# Patient Record
Sex: Female | Born: 1990
Health system: Southern US, Community
[De-identification: ages and names within clinical notes are randomized; demographics above are authoritative.]

## PROBLEM LIST (undated history)

## (undated) DIAGNOSIS — F32A Depression, unspecified: Secondary | ICD-10-CM

## (undated) DIAGNOSIS — K589 Irritable bowel syndrome without diarrhea: Secondary | ICD-10-CM

## (undated) DIAGNOSIS — D649 Anemia, unspecified: Secondary | ICD-10-CM

## (undated) DIAGNOSIS — F329 Major depressive disorder, single episode, unspecified: Secondary | ICD-10-CM

## (undated) DIAGNOSIS — F419 Anxiety disorder, unspecified: Secondary | ICD-10-CM

## (undated) HISTORY — PX: BREAST SURGERY: SHX581

## (undated) HISTORY — DX: Depression, unspecified: F32.A

## (undated) HISTORY — DX: Anxiety disorder, unspecified: F41.9

## (undated) HISTORY — DX: Major depressive disorder, single episode, unspecified: F32.9

## (undated) HISTORY — DX: Anemia, unspecified: D64.9

## (undated) HISTORY — DX: Irritable bowel syndrome, unspecified: K58.9

---
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
## 2012-02-26 ENCOUNTER — Encounter: Payer: Self-pay | Admitting: Family Medicine

## 2012-02-26 ENCOUNTER — Ambulatory Visit (INDEPENDENT_AMBULATORY_CARE_PROVIDER_SITE_OTHER): Payer: 59 | Admitting: Family Medicine

## 2012-02-26 ENCOUNTER — Ambulatory Visit: Payer: 59 | Admitting: Family Medicine

## 2012-02-26 VITALS — BP 120/80 | Temp 99.0°F | Wt 184.0 lb

## 2012-02-26 DIAGNOSIS — R5383 Other fatigue: Secondary | ICD-10-CM

## 2012-02-26 DIAGNOSIS — F418 Other specified anxiety disorders: Secondary | ICD-10-CM

## 2012-02-26 DIAGNOSIS — F341 Dysthymic disorder: Secondary | ICD-10-CM

## 2012-02-26 LAB — CBC WITH DIFFERENTIAL/PLATELET
Basophils Relative: 0.4 % (ref 0.0–3.0)
Eosinophils Absolute: 0.1 10*3/uL (ref 0.0–0.7)
HCT: 37.8 % (ref 36.0–46.0)
Lymphocytes Relative: 30.5 % (ref 12.0–46.0)
MCHC: 33.2 g/dL (ref 30.0–36.0)
Platelets: 251 10*3/uL (ref 150.0–400.0)
RDW: 14.3 % (ref 11.5–14.6)

## 2012-02-26 LAB — FERRITIN: Ferritin: 7.1 ng/mL — ABNORMAL LOW (ref 10.0–291.0)

## 2012-02-26 LAB — BASIC METABOLIC PANEL
Chloride: 106 mEq/L (ref 96–112)
GFR: 134.12 mL/min (ref >60.00)
Glucose, Bld: 89 mg/dL (ref 70–99)
Potassium: 3.9 mEq/L (ref 3.5–5.1)

## 2012-02-26 LAB — TSH: TSH: 0.39 u[IU]/mL (ref 0.35–5.50)

## 2012-02-26 LAB — IRON: Iron: 92 ug/dL (ref 42–145)

## 2012-02-26 LAB — HEPATIC FUNCTION PANEL
ALT: 15 U/L (ref 0–35)
AST: 17 U/L (ref 0–37)

## 2012-02-26 MED ORDER — LORAZEPAM 1 MG PO TABS
1.0000 mg | ORAL_TABLET | Freq: Two times a day (BID) | ORAL | Status: DC | PRN
Start: 2012-02-26 — End: 2012-08-29

## 2012-02-26 MED ORDER — FLUOXETINE HCL 20 MG PO TABS: 20.0000 mg | ORAL_TABLET | Freq: Every day | ORAL | Status: DC

## 2012-02-26 NOTE — Progress Notes (Signed)
  Subjective:    Patient ID: April Rivas, female    DOB: 05-18-1990, 22 y.o.   MRN: [id number]  HPI Here with her mother to discuss anxiety and depression. She has struggled with these off and on since she was 22 years old. At one point when she was younger she used to cut herself on the ams but she stopped this years ago. She feels sad, hopeless, worried and out of control at times. She admits to suicidal thoughts but denies ever having a plan or the intent to do this. She has trouble sleeping and her appetite is reduced. She may go all day eating nothing but a cup of yogurt. She feels tired all the time, and she has had dull muscle aches frequently, especially in the thighs. She has a hx of anemia, and was turned down last month when she tried to donate blood. He menses are regular although they are heavy. She is a sophomore at [employer], but she has taken a medical leave of absence this semester to come home and seek treatment.    Review of Systems  Constitutional: Positive for fatigue.  Respiratory: Negative.   Cardiovascular: Negative.   Neurological: Negative.   Psychiatric/Behavioral: Positive for suicidal ideas, sleep disturbance, dysphoric mood and decreased concentration. Negative for hallucinations, confusion and agitation. The patient is nervous/anxious.        Objective:   Physical Exam  Constitutional: She is oriented to person, place, and time. She appears well-developed and well-nourished.  Neck: No thyromegaly present.  Cardiovascular: Normal rate, regular rhythm, normal heart sounds and intact distal pulses.   Pulmonary/Chest: Effort normal and breath sounds normal.  Lymphadenopathy:    She has no cervical adenopathy.  Neurological: She is alert and oriented to person, place, and time.  Psychiatric: She has a normal mood and affect. Her behavior is normal. Judgment and thought content normal.          Assessment & Plan:  Start on Prozac every morning and  on Lorazepam at night for sleep. Recheck here in 2 weeks. She is already set up to see Dr. Evalee Jefferson this afternoon for therapy. Get labs today.

## 2012-02-27 ENCOUNTER — Ambulatory Visit: Payer: 59 | Admitting: Family Medicine

## 2012-03-03 MED ORDER — FERROUS SULFATE 325 (65 FE) MG PO TABS: 325.0000 mg | ORAL_TABLET | Freq: Every day | ORAL | Status: DC

## 2012-03-03 NOTE — Progress Notes (Signed)
Quick Note:  I spoke with pt and sent script e-scribe. ______ 

## 2012-03-03 NOTE — Addendum Note (Signed)
Addended by: Aniceto Boss A on: 03/03/2012 10:35 AM   Modules accepted: Orders

## 2012-03-11 ENCOUNTER — Ambulatory Visit (INDEPENDENT_AMBULATORY_CARE_PROVIDER_SITE_OTHER): Payer: 59 | Admitting: Family Medicine

## 2012-03-11 ENCOUNTER — Encounter: Payer: Self-pay | Admitting: Family Medicine

## 2012-03-11 VITALS — BP 112/74 | HR 88 | Temp 98.7°F | Wt 184.0 lb

## 2012-03-11 DIAGNOSIS — F329 Major depressive disorder, single episode, unspecified: Secondary | ICD-10-CM

## 2012-03-11 DIAGNOSIS — F341 Dysthymic disorder: Secondary | ICD-10-CM

## 2012-03-11 NOTE — Progress Notes (Signed)
  Subjective:    Patient ID: April Rivas, female    DOB: 24-Oct-1990, 22 y.o.   MRN: [id number]  HPI Here to follow up anxiety and depression. She has been taking Prozac 20 mg each morning and Lorazepam 1 mg at bedtime. She has not taken any Lorazepam during the daytime. She feels a little better with less anxiety, but she is dealing with feelings of anger more than before. She has met with Dr. Richardson Dopp for therapy twice and she thinks this will be helpful. She slept well the first week but now she has trouble relaxing at night again. She is looking for a job.    Review of Systems  Constitutional: Negative.   Neurological: Negative.   Psychiatric/Behavioral: Positive for sleep disturbance, dysphoric mood and decreased concentration. Negative for behavioral problems, confusion and agitation. The patient is nervous/anxious.        Objective:   Physical Exam  Constitutional: She is oriented to person, place, and time. She appears well-developed and well-nourished.  Neurological: She is alert and oriented to person, place, and time.  Psychiatric: She has a normal mood and affect. Her behavior is normal. Thought content normal.          Assessment & Plan:  We will keep the Prozac the same but increase the Lorazepam to 2 tabs qhs. Recheck in 2 weeks

## 2012-08-29 ENCOUNTER — Encounter: Payer: Self-pay | Admitting: Family Medicine

## 2012-08-29 ENCOUNTER — Ambulatory Visit (INDEPENDENT_AMBULATORY_CARE_PROVIDER_SITE_OTHER): Payer: 59 | Admitting: Family Medicine

## 2012-08-29 VITALS — BP 114/76 | HR 97 | Temp 99.3°F | Ht 67.5 in | Wt 188.0 lb

## 2012-08-29 DIAGNOSIS — Z0000 Encounter for general adult medical examination without abnormal findings: Secondary | ICD-10-CM

## 2012-08-29 LAB — CBC WITH DIFFERENTIAL/PLATELET
Basophils Absolute: 0 10*3/uL (ref 0.0–0.1)
Basophils Relative: 0.3 % (ref 0.0–3.0)
Eosinophils Relative: 1.1 % (ref 0.0–5.0)
MCHC: 33.2 g/dL (ref 30.0–36.0)
MCV: 84.1 fl (ref 78.0–100.0)
Monocytes Relative: 8.6 % (ref 3.0–12.0)
Neutro Abs: 4.7 10*3/uL (ref 1.4–7.7)
Neutrophils Relative %: 66.9 % (ref 43.0–77.0)
Platelets: 234 10*3/uL (ref 150.0–400.0)

## 2012-08-29 LAB — BASIC METABOLIC PANEL
BUN: 8 mg/dL (ref 6–23)
Calcium: 9.3 mg/dL (ref 8.4–10.5)
Creatinine, Ser: 0.6 mg/dL (ref 0.4–1.2)
GFR: 135.98 mL/min (ref >60.00)
Sodium: 139 mEq/L (ref 135–145)

## 2012-08-29 LAB — LIPID PANEL
Cholesterol: 148 mg/dL (ref 0–200)
HDL: 41.9 mg/dL (ref >39.00)
Triglycerides: 56 mg/dL (ref 0.0–149.0)
VLDL: 11.2 mg/dL (ref 0.0–40.0)

## 2012-08-29 LAB — HEPATIC FUNCTION PANEL
Alkaline Phosphatase: 84 U/L (ref 39–117)
Bilirubin, Direct: 0.1 mg/dL (ref 0.0–0.3)
Total Bilirubin: 0.4 mg/dL (ref 0.3–1.2)

## 2012-08-29 LAB — POCT URINALYSIS DIPSTICK
Leukocytes, UA: NEGATIVE
Nitrite, UA: NEGATIVE
Protein, UA: NEGATIVE
pH, UA: 5.5

## 2012-08-29 NOTE — Progress Notes (Signed)
  Subjective:    Patient ID: April Rivas, female    DOB: September 13, 1990, 22 y.o.   MRN: [id number]  HPI 22 yr old female for a cpx. She has questions about her meds. She is seeing Velta Addison for psychiatric care, and she is frustrated about how Prozac makes her feel tired all the time and it makes it hard for her to lose weight. She watches her diet and exercises every day.    Review of Systems  Constitutional: Negative.   HENT: Negative.   Eyes: Negative.   Respiratory: Negative.   Cardiovascular: Negative.   Gastrointestinal: Negative.   Genitourinary: Negative for dysuria, urgency, frequency, hematuria, flank pain, decreased urine volume, enuresis, difficulty urinating, pelvic pain and dyspareunia.  Musculoskeletal: Negative.   Skin: Negative.   Neurological: Negative.   Psychiatric/Behavioral: Negative.        Objective:   Physical Exam  Constitutional: She is oriented to person, place, and time. She appears well-developed and well-nourished. No distress.  HENT:  Head: Normocephalic and atraumatic.  Right Ear: External ear normal.  Left Ear: External ear normal.  Nose: Nose normal.  Mouth/Throat: Oropharynx is clear and moist. No oropharyngeal exudate.  Eyes: Conjunctivae and EOM are normal. Pupils are equal, round, and reactive to light. No scleral icterus.  Neck: Normal range of motion. Neck supple. No JVD present. No thyromegaly present.  Cardiovascular: Normal rate, regular rhythm, normal heart sounds and intact distal pulses.  Exam reveals no gallop and no friction rub.   No murmur heard. Pulmonary/Chest: Effort normal and breath sounds normal. No respiratory distress. She has no wheezes. She has no rales. She exhibits no tenderness.  Abdominal: Soft. Bowel sounds are normal. She exhibits no distension and no mass. There is no tenderness. There is no rebound and no guarding.  Musculoskeletal: Normal range of motion. She exhibits no edema and no tenderness.   Lymphadenopathy:    She has no cervical adenopathy.  Neurological: She is alert and oriented to person, place, and time. She has normal reflexes. No cranial nerve deficit. She exhibits normal muscle tone. Coordination normal.  Skin: Skin is warm and dry. No rash noted. No erythema.  Psychiatric: She has a normal mood and affect. Her behavior is normal. Judgment and thought content normal.          Assessment & Plan:  Well exam. Get fasting labs. Encouraged her to talk to Mr. Claudius Sis about her concerns.

## 2012-09-01 NOTE — Progress Notes (Signed)
Quick Note:  I left voice message with results. ______ 

## 2012-09-08 ENCOUNTER — Encounter: Payer: Self-pay | Admitting: Family Medicine

## 2012-12-18 ENCOUNTER — Other Ambulatory Visit: Payer: Self-pay

## 2014-02-18 ENCOUNTER — Encounter: Payer: Self-pay | Admitting: Family Medicine

## 2014-02-18 ENCOUNTER — Ambulatory Visit (INDEPENDENT_AMBULATORY_CARE_PROVIDER_SITE_OTHER): Payer: 59 | Admitting: Family Medicine

## 2014-02-18 VITALS — BP 129/83 | HR 100 | Temp 99.1°F | Ht 67.5 in | Wt 188.0 lb

## 2014-02-18 DIAGNOSIS — R059 Cough, unspecified: Secondary | ICD-10-CM

## 2014-02-18 DIAGNOSIS — R05 Cough: Secondary | ICD-10-CM

## 2014-02-18 DIAGNOSIS — N6012 Diffuse cystic mastopathy of left breast: Secondary | ICD-10-CM

## 2014-02-18 DIAGNOSIS — N6011 Diffuse cystic mastopathy of right breast: Secondary | ICD-10-CM

## 2014-02-18 MED ORDER — ALBUTEROL SULFATE HFA 108 (90 BASE) MCG/ACT IN AERS: 2.0000 | INHALATION_SPRAY | RESPIRATORY_TRACT | Status: DC | PRN

## 2014-02-18 NOTE — Progress Notes (Signed)
   Subjective:    Patient ID: April HarvestNichole Rivas, female    DOB: 07/16/1990, 24 y.o.   MRN: [id number]  HPI Here about 2 issues. First she has had a chronic intermittent cough for the past 3 years. It gets worse if she is very active or is exercising. No chest pain or SOB. No GERD sx. She is not exposed to smoke. The cough is non-productive. She has used a [employer]vertising account plannerroair inhaler from her friend several times and it always helps her feel better. No hx of asthma when she was younger. Also several weeks ago she felt a lump in the right breast during her first ever self breast exam. It is not tender and she has no other sx, but she is concerned about it. She actaullay made an appt with a GYN doctor for a full exam next week but she wanted to get my opinion about this.   Review of Systems  Constitutional: Negative.   HENT: Negative.   Eyes: Negative.   Respiratory: Positive for cough. Negative for choking, chest tightness, shortness of breath, wheezing and stridor.   Cardiovascular: Negative.        Objective:   Physical Exam  Constitutional: She appears well-developed and well-nourished.  Neck: No thyromegaly present.  Cardiovascular: Normal rate, regular rhythm, normal heart sounds and intact distal pulses.   Pulmonary/Chest: Effort normal and breath sounds normal. No respiratory distress. She has no wheezes. She has no rales. She exhibits no tenderness.  Genitourinary:  Both breasts show some densities laterally consistent with fibrocystic disease, no definable lumps and no tenderness  Lymphadenopathy:    She has no cervical adenopathy.          Assessment & Plan:  As far as the fibrocystic breast changes I reassured her these are benign and no further action is needed. Of course her GYN will re-evaluate this next week. It is not clear what the etiology of her cough may be but she may in fact be developing some mild asthma. Try a Proair inhaler prn. Get a CXR today.

## 2014-02-18 NOTE — Progress Notes (Signed)
Pre visit review using our clinic review tool, if applicable. No additional management support is needed unless otherwise documented below in the visit note. 

## 2014-03-01 ENCOUNTER — Ambulatory Visit (INDEPENDENT_AMBULATORY_CARE_PROVIDER_SITE_OTHER): Payer: 59 | Admitting: Family Medicine

## 2014-03-01 ENCOUNTER — Encounter: Payer: Self-pay | Admitting: Family Medicine

## 2014-03-01 VITALS — BP 110/84 | HR 76 | Ht 67.5 in | Wt 186.0 lb

## 2014-03-01 DIAGNOSIS — F418 Other specified anxiety disorders: Secondary | ICD-10-CM

## 2014-03-01 MED ORDER — FLUOXETINE HCL 10 MG PO CAPS: 10.0000 mg | ORAL_CAPSULE | Freq: Every day | ORAL | Status: DC

## 2014-03-01 NOTE — Progress Notes (Signed)
Pre visit review using our clinic review tool, if applicable. No additional management support is needed unless otherwise documented below in the visit note. 

## 2014-03-01 NOTE — Progress Notes (Signed)
   Subjective:    Patient ID: April HarvestNichole Korber, female    DOB: 06/27/1990, 24 y.o.   MRN: [id number]  HPI Here to ask if she can start back on Prozac. She took this about 2 years ago for depression and anxiety, and it worked well for her. She took 20 mg daily at that time, and it made her feel a little "out of it". She starts back in college classes tomorrow and she is very anxious about this.    Review of Systems  Constitutional: Negative.   Neurological: Negative.   Psychiatric/Behavioral: Positive for dysphoric mood and decreased concentration. Negative for hallucinations, behavioral problems, confusion and agitation. The patient is nervous/anxious. The patient is not hyperactive.        Objective:   Physical Exam  Constitutional: She is oriented to person, place, and time. She appears well-developed and well-nourished.  Neurological: She is alert and oriented to person, place, and time.  Psychiatric: She has a normal mood and affect. Her behavior is normal. Judgment and thought content normal.          Assessment & Plan:  We will get back on Prozac but will try it at 10 mg daily. Recheck in one month

## 2014-03-08 ENCOUNTER — Other Ambulatory Visit: Payer: Self-pay | Admitting: *Deleted

## 2014-03-08 DIAGNOSIS — Z0000 Encounter for general adult medical examination without abnormal findings: Secondary | ICD-10-CM

## 2014-03-09 ENCOUNTER — Other Ambulatory Visit (INDEPENDENT_AMBULATORY_CARE_PROVIDER_SITE_OTHER): Payer: 59

## 2014-03-09 DIAGNOSIS — R05 Cough: Secondary | ICD-10-CM

## 2014-03-09 DIAGNOSIS — Z0000 Encounter for general adult medical examination without abnormal findings: Secondary | ICD-10-CM

## 2014-03-09 DIAGNOSIS — R059 Cough, unspecified: Secondary | ICD-10-CM

## 2014-03-09 LAB — COMPREHENSIVE METABOLIC PANEL
ALK PHOS: 69 U/L (ref 39–117)
ALT: 16 U/L (ref 0–35)
AST: 16 U/L (ref 0–37)
Albumin: 4 g/dL (ref 3.5–5.2)
BUN: 14 mg/dL (ref 6–23)
CO2: 23 mEq/L (ref 19–32)
Calcium: 9.2 mg/dL (ref 8.4–10.5)
Chloride: 107 mEq/L (ref 96–112)
Creatinine, Ser: 0.61 mg/dL (ref 0.40–1.20)
GFR: 131.4 mL/min (ref >60.00)
GLUCOSE: 89 mg/dL (ref 70–99)
POTASSIUM: 4 meq/L (ref 3.5–5.1)
Sodium: 139 mEq/L (ref 135–145)
TOTAL PROTEIN: 7.4 g/dL (ref 6.0–8.3)
Total Bilirubin: 0.4 mg/dL (ref 0.2–1.2)

## 2014-03-09 LAB — LIPID PANEL
CHOL/HDL RATIO: 3
CHOLESTEROL: 144 mg/dL (ref 0–200)
HDL: 43.1 mg/dL (ref >39.00)
LDL CALC: 78 mg/dL (ref 0–99)
NonHDL: 100.9
Triglycerides: 113 mg/dL (ref 0.0–149.0)
VLDL: 22.6 mg/dL (ref 0.0–40.0)

## 2014-03-09 LAB — CBC WITH DIFFERENTIAL/PLATELET
BASOS ABS: 0 10*3/uL (ref 0.0–0.1)
Basophils Relative: 0.4 % (ref 0.0–3.0)
EOS ABS: 0.1 10*3/uL (ref 0.0–0.7)
Eosinophils Relative: 1.8 % (ref 0.0–5.0)
HCT: 38.5 % (ref 36.0–46.0)
Hemoglobin: 12.9 g/dL (ref 12.0–15.0)
LYMPHS ABS: 1.4 10*3/uL (ref 0.7–4.0)
LYMPHS PCT: 27 % (ref 12.0–46.0)
MCHC: 33.5 g/dL (ref 30.0–36.0)
MCV: 80.5 fl (ref 78.0–100.0)
Monocytes Absolute: 0.5 10*3/uL (ref 0.1–1.0)
Monocytes Relative: 8.6 % (ref 3.0–12.0)
NEUTROS PCT: 62.2 % (ref 43.0–77.0)
Neutro Abs: 3.3 10*3/uL (ref 1.4–7.7)
Platelets: 205 10*3/uL (ref 150.0–400.0)
RBC: 4.78 Mil/uL (ref 3.87–5.11)
RDW: 13.5 % (ref 11.5–15.5)
WBC: 5.4 10*3/uL (ref 4.0–10.5)

## 2014-03-09 LAB — TSH: TSH: 0.91 u[IU]/mL (ref 0.35–4.50)

## 2014-03-25 ENCOUNTER — Telehealth: Payer: Self-pay | Admitting: Family Medicine

## 2014-03-25 NOTE — Telephone Encounter (Signed)
Pt would results of labs done 1/26. Pls call

## 2014-03-25 NOTE — Telephone Encounter (Signed)
Called and spoke with pt and pt is aware.  Per last lab note labs are normal.  Pt verbalized understanding and is aware labs were released to mcyhart.

## 2014-03-30 ENCOUNTER — Ambulatory Visit (INDEPENDENT_AMBULATORY_CARE_PROVIDER_SITE_OTHER): Payer: 59 | Admitting: Nurse Practitioner

## 2014-03-30 ENCOUNTER — Encounter: Payer: Self-pay | Admitting: Nurse Practitioner

## 2014-03-30 VITALS — BP 102/74 | HR 78 | Resp 18 | Ht 69.0 in | Wt 184.2 lb

## 2014-03-30 DIAGNOSIS — Z01419 Encounter for gynecological examination (general) (routine) without abnormal findings: Secondary | ICD-10-CM

## 2014-03-30 DIAGNOSIS — Z30019 Encounter for initial prescription of contraceptives, unspecified: Secondary | ICD-10-CM

## 2014-03-30 DIAGNOSIS — Z0000 Encounter for general adult medical examination without abnormal findings: Secondary | ICD-10-CM

## 2014-03-30 LAB — POCT URINALYSIS DIPSTICK
Leukocytes, UA: NEGATIVE
UROBILINOGEN UA: NEGATIVE

## 2014-03-30 MED ORDER — ETONOGESTREL-ETHINYL ESTRADIOL 0.12-0.015 MG/24HR VA RING: VAGINAL_RING | VAGINAL | Status: DC

## 2014-03-30 NOTE — Patient Instructions (Addendum)
General topics  Next pap or exam is  due in 1 year Take a Women's multivitamin Take 1200 mg. of calcium daily - prefer dietary If any concerns in interim to call back  Breast Self-Awareness Practicing breast self-awareness may pick up problems early, prevent significant medical complications, and possibly save your life. By practicing breast self-awareness, you can become familiar with how your breasts look and feel and if your breasts are changing. This allows you to notice changes early. It can also offer you some reassurance that your breast health is good. One way to learn what is normal for your breasts and whether your breasts are changing is to do a breast self-exam. If you find a lump or something that was not present in the past, it is best to contact your caregiver right away. Other findings that should be evaluated by your caregiver include nipple discharge, especially if it is bloody; skin changes or reddening; areas where the skin seems to be pulled in (retracted); or new lumps and bumps. Breast pain is seldom associated with cancer (malignancy), but should also be evaluated by a caregiver. BREAST SELF-EXAM The best time to examine your breasts is 5 7 days after your menstrual period is over.  ExitCare Patient Information 2013 [employer].   Exercise to Stay Healthy Exercise helps you become and stay healthy. EXERCISE IDEAS AND TIPS Choose exercises that:  You enjoy.  Fit into your day. You do not need to exercise really hard to be healthy. You can do exercises at a slow or medium level and stay healthy. You can:  Stretch before and after working out.  Try yoga, Pilates, or tai chi.  Lift weights.  Walk fast, swim, jog, run, climb stairs, bicycle, dance, or rollerskate.  Take aerobic classes. Exercises that burn about 150 calories:  Running 1  miles in 15 minutes.  Playing volleyball for 45 to 60 minutes.  Washing and waxing a car for 45 to 60  minutes.  Playing touch football for 45 minutes.  Walking 1  miles in 35 minutes.  Pushing a stroller 1  miles in 30 minutes.  Playing basketball for 30 minutes.  Raking leaves for 30 minutes.  Bicycling 5 miles in 30 minutes.  Walking 2 miles in 30 minutes.  Dancing for 30 minutes.  Shoveling snow for 15 minutes.  Swimming laps for 20 minutes.  Walking up stairs for 15 minutes.  Bicycling 4 miles in 15 minutes.  Gardening for 30 to 45 minutes.  Jumping rope for 15 minutes.  Washing windows or floors for 45 to 60 minutes. Document Released: 03/03/2010 Document Revised: 04/23/2011 Document Reviewed: 03/03/2010 Exit[employer] Patient Information 2013 [employer].   Other topics ( that may be useful information):    Sexually Transmitted Disease Sexually transmitted disease (STD) refers to any infection that is passed from person to person during sexual activity. This may happen by way of saliva, semen, blood, vaginal mucus, or urine. Common STDs include:  Gonorrhea.  Chlamydia.  Syphilis.  HIV/AIDS.  Genital herpes.  Hepatitis B and C.  Trichomonas.  Human papillomavirus (HPV).  Pubic lice. CAUSES  An STD may be spread by bacteria, virus, or parasite. A person can get an STD by:  Sexual intercourse with an infected person.  Sharing sex toys with an infected person.  Sharing needles with an infected person.  Having intimate contact with the genitals, mouth, or rectal areas of an infected person. SYMPTOMS  Some people may not have any symptoms, but   they can still pass the infection to others. Different STDs have different symptoms. Symptoms include:  Painful or bloody urination.  Pain in the pelvis, abdomen, vagina, anus, throat, or eyes.  Skin rash, itching, irritation, growths, or sores (lesions). These usually occur in the genital or anal area.  Abnormal vaginal discharge.  Penile discharge in men.  Soft, flesh-colored skin growths in the  genital or anal area.  Fever.  Pain or bleeding during sexual intercourse.  Swollen glands in the groin area.  Yellow skin and eyes (jaundice). This is seen with hepatitis. DIAGNOSIS  To make a diagnosis, your caregiver may:  Take a medical history.  Perform a physical exam.  Take a specimen (culture) to be examined.  Examine a sample of discharge under a microscope.  Perform blood test TREATMENT   Chlamydia, gonorrhea, trichomonas, and syphilis can be cured with antibiotic medicine.  Genital herpes, hepatitis, and HIV can be treated, but not cured, with prescribed medicines. The medicines will lessen the symptoms.  Genital warts from HPV can be treated with medicine or by freezing, burning (electrocautery), or surgery. Warts may come back.  HPV is a virus and cannot be cured with medicine or surgery.However, abnormal areas may be followed very closely by your caregiver and may be removed from the cervix, vagina, or vulva through office procedures or surgery. If your diagnosis is confirmed, your recent sexual partners need treatment. This is true even if they are symptom-free or have a negative culture or evaluation. They should not have sex until their caregiver says it is okay. HOME CARE INSTRUCTIONS  All sexual partners should be informed, tested, and treated for all STDs.  Take your antibiotics as directed. Finish them even if you start to feel better.  Only take over-the-counter or prescription medicines for pain, discomfort, or fever as directed by your caregiver.  Rest.  Eat a balanced diet and drink enough fluids to keep your urine clear or pale yellow.  Do not have sex until treatment is completed and you have followed up with your caregiver. STDs should be checked after treatment.  Keep all follow-up appointments, Pap tests, and blood tests as directed by your caregiver.  Only use latex condoms and water-soluble lubricants during sexual activity. Do not use  petroleum jelly or oils.  Avoid alcohol and illegal drugs.  Get vaccinated for HPV and hepatitis. If you have not received these vaccines in the past, talk to your caregiver about whether one or both might be right for you.  Avoid risky sex practices that can break the skin. The only way to avoid getting an STD is to avoid all sexual activity.Latex condoms and dental dams (for oral sex) will help lessen the risk of getting an STD, but will not completely eliminate the risk. SEEK MEDICAL CARE IF:   You have a fever.  You have any new or worsening symptoms. Document Released: 04/21/2002 Document Revised: 04/23/2011 Document Reviewed: 04/28/2010 [facility] Patient Information 2013 [employer].    Domestic Abuse You are being battered or abused if someone close to you hits, pushes, or physically hurts you in any way. You also are being abused if you are forced into activities. You are being sexually abused if you are forced to have sexual contact of any kind. You are being emotionally abused if you are made to feel worthless or if you are constantly threatened. It is important to remember that help is available. No one has the right to abuse you. PREVENTION OF FURTHER  ABUSE  Learn the warning signs of danger. This varies with situations but may include: the use of alcohol, threats, isolation from friends and family, or forced sexual contact. Leave if you feel that violence is going to occur.  If you are attacked or beaten, report it to the police so the abuse is documented. You do not have to press charges. The police can protect you while you or the attackers are leaving. Get the officer's name and badge number and a copy of the report.  Find someone you can trust and tell them what is happening to you: your caregiver, a nurse, clergy member, close friend or family member. Feeling ashamed is natural, but remember that you have done nothing wrong. No one deserves abuse. Document Released:  01/27/2000 Document Revised: 04/23/2011 Document Reviewed: 04/06/2010 ExitCare Patient Information 2013 [employer].    How Much is Too Much Alcohol? Drinking too much alcohol can cause injury, accidents, and health problems. These types of problems can include:   Car crashes.  Falls.  Family fighting (domestic violence).  Drowning.  Fights.  Injuries.  Burns.  Damage to certain organs.  Having a baby with birth defects. ONE DRINK CAN BE TOO MUCH WHEN YOU ARE:  Working.  Pregnant or breastfeeding.  Taking medicines. Ask your doctor.  Driving or planning to drive. If you or someone you know has a drinking problem, get help from a doctor.  Document Released: 11/25/2008 Document Revised: 04/23/2011 Document Reviewed: 11/25/2008 ExitCare Patient Information 2013 [employer].   Smoking Hazards Smoking cigarettes is extremely bad for your health. Tobacco smoke has over 200 known poisons in it. There are over 60 chemicals in tobacco smoke that cause cancer. Some of the chemicals found in cigarette smoke include:   Cyanide.  Benzene.  Formaldehyde.  Methanol (wood alcohol).  Acetylene (fuel used in welding torches).  Ammonia. Cigarette smoke also contains the poisonous gases nitrogen oxide and carbon monoxide.  Cigarette smokers have an increased risk of many serious medical problems and Smoking causes approximately:  90% of all lung cancer deaths in men.  80% of all lung cancer deaths in women.  90% of deaths from chronic obstructive lung disease. Compared with nonsmokers, smoking increases the risk of:  Coronary heart disease by 2 to 4 times.  Stroke by 2 to 4 times.  Men developing lung cancer by 23 times.  Women developing lung cancer by 13 times.  Dying from chronic obstructive lung diseases by 12 times.  . Smoking is the most preventable cause of death and disease in our society.  WHY IS SMOKING ADDICTIVE?  Nicotine is the chemical  agent in tobacco that is capable of causing addiction or dependence.  When you smoke and inhale, nicotine is absorbed rapidly into the bloodstream through your lungs. Nicotine absorbed through the lungs is capable of creating a powerful addiction. Both inhaled and non-inhaled nicotine may be addictive.  Addiction studies of cigarettes and spit tobacco show that addiction to nicotine occurs mainly during the teen years, when young people begin using tobacco products. WHAT ARE THE BENEFITS OF QUITTING?  There are many health benefits to quitting smoking.   Likelihood of developing cancer and heart disease decreases. Health improvements are seen almost immediately.  Blood pressure, pulse rate, and breathing patterns start returning to normal soon after quitting. QUITTING SMOKING   American Lung Association - 1-800-LUNGUSA  American Cancer Society - 1-800-ACS-2345 Document Released: 03/08/2004 Document Revised: 04/23/2011 Document Reviewed: 11/10/2008 ExitCare Patient Information 2013 [employer].   Stress Management Stress is a state of physical or mental tension that often results from changes in your life or normal routine. Some common causes of stress are:  Death of a loved one.  Injuries or severe illnesses.  Getting fired or changing jobs.  Moving into a new home. Other causes may be:  Sexual problems.  Business or financial losses.  Taking on a large debt.  Regular conflict with someone at home or at work.  Constant tiredness from lack of sleep. It is not just bad things that are stressful. It may be stressful to:  Win the lottery.  Get married.  Buy a new car. The amount of stress that can be easily tolerated varies from person to person. Changes generally cause stress, regardless of the types of change. Too much stress can affect your health. It may lead to physical or emotional problems. Too little stress (boredom) may also become stressful. SUGGESTIONS TO  REDUCE STRESS:  Talk things over with your family and friends. It often is helpful to share your concerns and worries. If you feel your problem is serious, you may want to get help from a professional counselor.  Consider your problems one at a time instead of lumping them all together. Trying to take care of everything at once may seem impossible. List all the things you need to do and then start with the most important one. Set a goal to accomplish 2 or 3 things each day. If you expect to do too many in a single day you will naturally fail, causing you to feel even more stressed.  Do not use alcohol or drugs to relieve stress. Although you may feel better for a short time, they do not remove the problems that caused the stress. They can also be habit forming.  Exercise regularly - at least 3 times per week. Physical exercise can help to relieve that "uptight" feeling and will relax you.  The shortest distance between despair and hope is often a good night's sleep.  Go to bed and get up on time allowing yourself time for appointments without being rushed.  Take a short "time-out" period from any stressful situation that occurs during the day. Close your eyes and take some deep breaths. Starting with the muscles in your face, tense them, hold it for a few seconds, then relax. Repeat this with the muscles in your neck, shoulders, hand, stomach, back and legs.  Take good care of yourself. Eat a balanced diet and get plenty of rest.  Schedule time for having fun. Take a break from your daily routine to relax. HOME CARE INSTRUCTIONS   Call if you feel overwhelmed by your problems and feel you can no longer manage them on your own.  Return immediately if you feel like hurting yourself or someone else. Document Released: 07/25/2000 Document Revised: 04/23/2011 Document Reviewed: 03/17/2007 ExitCare Patient Information 2013 [employer].  Ethinyl Estradiol; Etonogestrel vaginal ring What is  this medicine? ETHINYL ESTRADIOL; ETONOGESTREL (ETH in il es tra DYE ole; et oh noe JES trel) vaginal ring is a flexible, vaginal ring used as a contraceptive (birth control method). This medicine combines two types of female hormones, an estrogen and a progestin. This ring is used to prevent ovulation and pregnancy. Each ring is effective for one month. This medicine may be used for other purposes; ask your health care provider or pharmacist if you have questions. COMMON BRAND NAME(S): NuvaRing What should I tell my health care   provider before I take this medicine? They need to know if you have or ever had any of these conditions: -abnormal vaginal bleeding -blood vessel disease or blood clots -breast, cervical, endometrial, ovarian, liver, or uterine cancer -diabetes -gallbladder disease -heart disease or recent heart attack -high blood pressure -high cholesterol -kidney disease -liver disease -migraine headaches -stroke -systemic lupus erythematosus (SLE) -tobacco smoker -an unusual or allergic reaction to estrogens, progestins, other medicines, foods, dyes, or preservatives -pregnant or trying to get pregnant -breast-feeding How should I use this medicine? Insert the ring into your vagina as directed. Follow the directions on the prescription label. The ring will remain place for 3 weeks and is then removed for a 1-week break. A new ring is inserted 1 week after the last ring was removed, on the same day of the week. Do not use more often than directed. A patient package insert for the product will be given with each prescription and refill. Read this sheet carefully each time. The sheet may change frequently. Contact your pediatrician regarding the use of this medicine in children. Special care may be needed. This medicine has been used in female children who have started having menstrual periods. Overdosage: If you think you have taken too much of this medicine contact a poison  control center or emergency room at once. NOTE: This medicine is only for you. Do not share this medicine with others. What if I miss a dose? You will need to replace your vaginal ring once a month as directed. If the ring should slip out, or if you leave it in longer or shorter than you should, contact your health care professional for advice. What may interact with this medicine? -acetaminophen -antibiotics or medicines for infections, especially rifampin, rifabutin, rifapentine, and griseofulvin, and possibly penicillins or tetracyclines -aprepitant -ascorbic acid (vitamin C) -atorvastatin -barbiturate medicines, such as phenobarbital -bosentan -carbamazepine -caffeine -clofibrate -cyclosporine -dantrolene -doxercalciferol -felbamate -grapefruit juice -hydrocortisone -medicines for anxiety or sleeping problems, such as diazepam or temazepam -medicines for diabetes, including pioglitazone -modafinil -mycophenolate -nefazodone -oxcarbazepine -phenytoin -prednisolone -ritonavir or other medicines for HIV infection or AIDS -rosuvastatin -selegiline -soy isoflavones supplements -St. John's wort -tamoxifen or raloxifene -theophylline -thyroid hormones -topiramate -warfarin This list may not describe all possible interactions. Give your health care provider a list of all the medicines, herbs, non-prescription drugs, or dietary supplements you use. Also tell them if you smoke, drink alcohol, or use illegal drugs. Some items may interact with your medicine. What should I watch for while using this medicine? Visit your doctor or health care professional for regular checks on your progress. You will need a regular breast and pelvic exam and Pap smear while on this medicine. Use an additional method of contraception during the first cycle that you use this ring. If you have any reason to think you are pregnant, stop using this medicine right away and contact your doctor or health  care professional. If you are using this medicine for hormone related problems, it may take several cycles of use to see improvement in your condition. Smoking increases the risk of getting a blood clot or having a stroke while you are using hormonal birth control, especially if you are more than 22 years old. You are strongly advised not to smoke. This medicine can make your body retain fluid, making your fingers, hands, or ankles swell. Your blood pressure can go up. Contact your doctor or health care professional if you feel you are retaining fluid. This medicine can make   you more sensitive to the sun. Keep out of the sun. If you cannot avoid being in the sun, wear protective clothing and use sunscreen. Do not use sun lamps or tanning beds/booths. If you wear contact lenses and notice visual changes, or if the lenses begin to feel uncomfortable, consult your eye care specialist. In some women, tenderness, swelling, or minor bleeding of the gums may occur. Notify your dentist if this happens. Brushing and flossing your teeth regularly may help limit this. See your dentist regularly and inform your dentist of the medicines you are taking. If you are going to have elective surgery, you may need to stop using this medicine before the surgery. Consult your health care professional for advice. This medicine does not protect you against HIV infection (AIDS) or any other sexually transmitted diseases. What side effects may I notice from receiving this medicine? Side effects that you should report to your doctor or health care professional as soon as possible: -breast tissue changes or discharge -changes in vaginal bleeding during your period or between your periods -chest pain -coughing up blood -dizziness or fainting spells -headaches or migraines -leg, arm or groin pain -severe or sudden headaches -stomach pain (severe) -sudden shortness of breath -sudden loss of coordination, especially on one  side of the body -speech problems -symptoms of vaginal infection like itching, irritation or unusual discharge -tenderness in the upper abdomen -vomiting -weakness or numbness in the arms or legs, especially on one side of the body -yellowing of the eyes or skin Side effects that usually do not require medical attention (report to your doctor or health care professional if they continue or are bothersome): -breakthrough bleeding and spotting that continues beyond the 3 initial cycles of pills -breast tenderness -mood changes, anxiety, depression, frustration, anger, or emotional outbursts -increased sensitivity to sun or ultraviolet light -nausea -skin rash, acne, or brown spots on the skin -weight gain (slight) This list may not describe all possible side effects. Call your doctor for medical advice about side effects. You may report side effects to FDA at 1-800-FDA-1088. Where should I keep my medicine? Keep out of the reach of children. Store at room temperature between 15 and 30 degrees C (59 and 86 degrees F) for up to 4 months. The product will expire after 4 months. Protect from light. Throw away any unused medicine after the expiration date. NOTE: This sheet is a summary. It may not cover all possible information. If you have questions about this medicine, talk to your doctor, pharmacist, or health care provider.  2015, [employer]. (2008-01-15 12:03:58)  

## 2014-03-30 NOTE — Progress Notes (Signed)
24 y.o. G0 Single  Caucasian Fe here for NGYN annual exam.  Menses is regular with menarche at age 19.  Cycles last 5 days.  4 days of moderate to heavy.  Regular Tampon changing every 4-6 hours.  Cramps are relieved with magnesium and OTC NSAID's.  Some PMS and fatigue.  FMH of anemia and currently followed by PCP.  Also has concerns about a raised tender area next to the clitoral hood from masturbation.  Has history of acne and uses OTC counter med's with good relief.  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  Now works as a Social worker.  Dating but not SA ever.  May consider this in the near future.   Patient's last menstrual period was 03/25/2014.          Sexually active: No.  The current method of family planning is Abstinence.    Exercising: Yes.    cardio, lifiting, yoga, swimming 3-5x/wk Smoker:  no  Health Maintenance: Pap:  Never had one  TDaP:  2012 Labs: Larey Dresser, MD ; Urine: Trace Protein, Trace RBC'S (menses)   reports that she has never smoked. She has never used smokeless tobacco. She reports that she drinks alcohol. She reports that she does not use illicit drugs.  Past Medical History  Diagnosis Date  . IBS (irritable bowel syndrome)   . Anemia   . Depression   . Anxiety     No past surgical history on file.  Current Outpatient Prescriptions  Medication Sig Dispense Refill  . albuterol (PROVENTIL HFA;VENTOLIN HFA) 108 (90 BASE) MCG/ACT inhaler Inhale 2 puffs into the lungs every 4 (four) hours as needed for wheezing or shortness of breath. 1 Inhaler 5  . FLUoxetine (PROZAC) 10 MG capsule Take 1 capsule (10 mg total) by mouth daily. 30 capsule 3  . Naproxen Sod-Diphenhydramine (ALEVE PM PO) Take by mouth.    . Naproxen Sodium (ALEVE PO) Take by mouth.    . etonogestrel-ethinyl estradiol (NUVARING) 0.12-0.015 MG/24HR vaginal ring Insert vaginally and leave in place for 3 consecutive weeks, then remove for 1 week. 3 each 0   No current  facility-administered medications for this visit.    Family History  Problem Relation Age of Onset  . Depression    . Melanoma      PGF  . Anemia Mother   . Hypertension Mother   . Diabetes Mellitus II Paternal Grandmother 33  . Breast cancer Paternal Grandmother 33    lumpectomy and 2 months of chemo  . Heart failure Maternal Grandfather   . Hypertension      Maternal Side   . Stroke Maternal Grandmother   . Diabetes Paternal Grandfather     ROS:  Pertinent items are noted in HPI.  Otherwise, a comprehensive ROS was negative.  Exam:   BP 102/74 mmHg  Pulse 78  Resp 18  Ht  (1.753 m)  Wt 184 lb 3.2 oz (83.553 kg)  BMI 27.19 kg/m2  LMP 03/25/2014 Height:  (175.3 cm) Ht Readings from Last 3 Encounters:  03/30/14  (1.753 m)  03/01/14 5' 7.5" (1.715 m)  02/18/14 5' 7.5" (1.715 m)    General appearance: alert, cooperative and appears stated age Head: Normocephalic, without obvious abnormality, atraumatic Neck: no adenopathy, supple, symmetrical, trachea midline and thyroid normal to inspection and palpation Lungs: clear to auscultation bilaterally Breasts: normal appearance, no masses or tenderness Heart: regular rate and rhythm Abdomen: soft, non-tender;  no masses,  no organomegaly Extremities: extremities normal, atraumatic, no cyanosis or edema Skin: Skin color, texture, turgor normal. No rashes or lesions Lymph nodes: Cervical, supraclavicular, and axillary nodes normal. No abnormal inguinal nodes palpated Neurologic: Grossly normal   Pelvic: External genitalia:  no lesions              Urethra:  normal appearing urethra with no masses, tenderness or lesions              Bartholin's and Skene's: normal                 Vagina: normal appearing vagina with normal color and discharge, no lesions              Cervix: anteverted              Pap taken: Yes.   Bimanual Exam:  Uterus:  normal size, contour, position, consistency, mobility, non-tender               Adnexa: no mass, fullness, tenderness               Rectovaginal: Confirms               Anus:  normal sphincter tone, no lesions  Chaperone present: Yes  A:  Well Woman with normal exam  First GYN - pap  History of FCB  History of acne  Desires contraception  P:   Reviewed health and wellness pertinent to exam  Pap smear taken today  Discussed all forms of birth control including OCP, POP, Nexplanon. Depo Provera, Skyla IUD and Nuva Ring.  She desires Paediatric nurseuva Ring.  She has discussed different forms of birth control with classmates and has already decided that the ring fits her schedule the best.  Rx. Is given for 3 months - reviewed side effects, risk of DVT, start date, etc  counseled on breast self exam, STD prevention, HIV risk factors and prevention, use and side effects of birth control, adequate intake of calcium and vitamin D, diet and exercise return annually or prn  An After Visit Summary was printed and given to the patient.  Consult for choices of birth control was 15 minutes in addition to AEX.

## 2014-03-31 LAB — IPS PAP TEST WITH REFLEX TO HPV

## 2014-04-04 NOTE — Progress Notes (Signed)
Encounter reviewed by Dr. Ollie Esty Silva.  

## 2014-04-14 ENCOUNTER — Telehealth: Payer: Self-pay | Admitting: Nurse Practitioner

## 2014-04-14 NOTE — Telephone Encounter (Signed)
Spoke with patient. Patient states this is her first month using the Nuvaring and "I never really have menstrual cramping and if I do it is usually really light. I am supposed to take the Nuvaring out on Monday and I have been having increased cramping this week. It is a lot more than usual. I have also noticed that my hair is thinning. I get my hair bleached and some of it is falling out but it seems like it has been thinning out more." Advised patient cramping may be related to body adjustment to new birth control usage. Advised hair thinning may be from bleaching but could also be from something else going on in body. "I had lab work in January and everything was normal before I started the Nuvaring." Advised patient would speak with covering provider in regards to symptoms and return call with further recommendations. Patient is agreeable.  Verner Choleborah S. Leonard CNM please advise. Best for patient to be seen for OV?

## 2014-04-14 NOTE — Telephone Encounter (Signed)
Spoke with patient. Advised patient of message as seen below from Verner Choleborah S. Leonard CNM . Patient is agreeable and verbalizes understanding. "The cramping is really not a problem right now. It is light cramping. I just wanted to make sure everything was okay." Advised patient to monitor hair loss and cramping. If it continues or worsens will need to be seen for an office visit with Lauro FranklinPatricia Rolen-Grubb, FNP. Patient is agreeable and verbalizes understanding.  Routing to provider for final review. Patient agreeable to disposition. Will close encounter

## 2014-04-14 NOTE — Telephone Encounter (Signed)
The hair issues is not from Nuvaring , because of balanced hormones. If she is uncomfortable with cramping she probably needs to be seen tomorrow.

## 2014-04-14 NOTE — Telephone Encounter (Signed)
Patient is having some side effects from her birth control. Last seen 03/30/14.

## 2014-04-26 ENCOUNTER — Telehealth: Payer: Self-pay | Admitting: Nurse Practitioner

## 2014-04-26 NOTE — Telephone Encounter (Signed)
Pt would like to speak with a nurse regarding signs/ symptoms for blood clots since starting new birth control. Pt states she has an anxiety disorder and her family has a history of strokes. Pt states she does not have any current symptoms she is concerned about. She would just like to clarify with a nurse.

## 2014-04-26 NOTE — Telephone Encounter (Signed)
Spoke with patient. Patient states "I have an anxiety disorder and my family has a history of stroke and clots. I am just worried and want to know the symptoms of blood clots with birth control. That way I know what to look for." Advised patient symptoms with DVT include: pain in calf, foot or leg, swelling, redness, temperature change in extremity, and feeling of tightness. Patient denies any current symptoms. Advised if she has any of these symptoms will need to call the office to be seen for evaluation. Advised if she experiences any of these symptoms with shortness of breath, racing heart, or feeling of dizziness with also need to be seen for further evaluation for PE with DVT. Patient is agreeable and verbalizes understanding.  Routing to provider for final review. Patient agreeable to disposition. Will close encounter.

## 2014-04-27 ENCOUNTER — Telehealth: Payer: Self-pay | Admitting: Nurse Practitioner

## 2014-04-27 NOTE — Telephone Encounter (Signed)
Patient thinks she may be having an anxiety attack. She states she has been stressing out about possible blood clots.

## 2014-04-27 NOTE — Telephone Encounter (Signed)
Spoke with patient. Advised of message as seen below from Verner Choleborah S. Leonard CNM. Patient is agreeable and verbalizes understanding. Patient will remove Nuvaring at this time. Agreeable to be seen at local ER if symptoms persist.  Routing to provider for final review. Patient agreeable to disposition. Will close encounter

## 2014-04-27 NOTE — Telephone Encounter (Signed)
Please call patient and have her remove her Nuvaring until she is seen here for assessment. If sexually active needs to use condoms. If symptoms persist needs to be evaluated in ER. She may have some withdrawal bleeding with removal of Nuvaring.

## 2014-04-27 NOTE — Telephone Encounter (Signed)
Spoke with patient at time of incoming call. Patient states "I called in the other day. I have an anxiety disorder and I am really worried about getting a DVT or pulmonary embolism with my birth control. I have a strong history of strokes and heart problems in my family and I am really worried." Patient states that she has been experiencing a "heaviness" in her chest today that is predominately on the right side. States that it feels like a dull ache. Denies any sharp pain, shortness of breath, pain with inhalation, or coughing. "It could all be my anxiety and is probably nothing but I wanted to check." States two days ago she also noticed her leg was more "sensitive." Denies any pain, tightness, warmth, redness, or swelling of leg. No current discomfort of leg. Patient is currently on Prozac 10mg  daily for anxiety. Is using Nuvaring for contraception. Advised patient will need to be seen for evaluation. Patient declines appointment until Thursday. Appointment schedule for 3/17 at 10:15am with Lauro FranklinPatricia Rolen-Grubb, FNP. Patient is agreeable to date and time. Advised patient if new symptoms develop or worsen will need to be seen for immediate care at local ER. Patient is agreeable and verbalizes understanding. Advised would route to covering provider and return call if anything further is recommended.

## 2014-04-29 ENCOUNTER — Encounter: Payer: Self-pay | Admitting: Nurse Practitioner

## 2014-04-29 ENCOUNTER — Ambulatory Visit (INDEPENDENT_AMBULATORY_CARE_PROVIDER_SITE_OTHER): Payer: 59 | Admitting: Nurse Practitioner

## 2014-04-29 VITALS — BP 110/80 | HR 70 | Resp 16 | Ht 69.0 in | Wt 182.0 lb

## 2014-04-29 DIAGNOSIS — M79604 Pain in right leg: Secondary | ICD-10-CM
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
## 2014-04-29 NOTE — Patient Instructions (Signed)
Will stay off Nuva Ring for 2 months until all bruising is resolved.

## 2014-05-12 ENCOUNTER — Encounter: Payer: Self-pay | Admitting: Nurse Practitioner

## 2014-05-13 NOTE — Progress Notes (Signed)
Encounter reviewed by Dr. Brook Silva.  

## 2014-06-15 ENCOUNTER — Other Ambulatory Visit: Payer: Self-pay | Admitting: Nurse Practitioner

## 2014-06-15 MED ORDER — ETONOGESTREL-ETHINYL ESTRADIOL 0.12-0.015 MG/24HR VA RING: 1.0000 | VAGINAL_RING | VAGINAL | Status: DC

## 2014-06-15 NOTE — Telephone Encounter (Signed)
Patient calling requesting a refill on her birth control. Pharmacy on file is correct. °

## 2014-06-15 NOTE — Telephone Encounter (Signed)
Medication refill request: Nuvaring  Last AEX:  03/30/14 with PG  Next AEX: 04/01/15 with PG Last MMG (if hormonal medication request): N/A Refill authorized: #1 ring?, please advise (Patient is scheduled for med recheck 07/01/14 with you Ms. Patty)

## 2014-06-16 ENCOUNTER — Other Ambulatory Visit: Payer: Self-pay | Admitting: Nurse Practitioner

## 2014-06-16 NOTE — Telephone Encounter (Signed)
Medication refill request: Nuvaring  Last AEX:  03/30/14  Next AEX: 04/01/15 PG Last MMG (if hormonal medication request): none Refill authorized: 06/15/14 #1each/ 0R. To [facility].

## 2014-06-16 NOTE — Telephone Encounter (Signed)
LM on patient's vm that rx has been sent to pharmacy. 

## 2014-06-25 ENCOUNTER — Other Ambulatory Visit: Payer: Self-pay | Admitting: Family Medicine

## 2014-07-01 ENCOUNTER — Encounter: Payer: Self-pay | Admitting: Nurse Practitioner

## 2014-07-01 ENCOUNTER — Ambulatory Visit (INDEPENDENT_AMBULATORY_CARE_PROVIDER_SITE_OTHER): Payer: 59 | Admitting: Nurse Practitioner

## 2014-07-01 VITALS — BP 120/86 | HR 88 | Ht 69.0 in | Wt 185.6 lb

## 2014-07-01 DIAGNOSIS — Z3009 Encounter for other general counseling and advice on contraception: Secondary | ICD-10-CM

## 2014-07-01 MED ORDER — ETONOGESTREL-ETHINYL ESTRADIOL 0.12-0.015 MG/24HR VA RING: 1.0000 | VAGINAL_RING | VAGINAL | Status: DC

## 2014-07-01 NOTE — Patient Instructions (Signed)
Will see back at next annual.
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
## 2014-07-04 NOTE — Progress Notes (Signed)
Encounter reviewed by Dr. Danner Paulding Silva.  

## 2014-08-04 ENCOUNTER — Telehealth: Payer: Self-pay | Admitting: Family Medicine

## 2014-08-04 NOTE — Telephone Encounter (Signed)
Pt would like to know if you will increase her  FLUoxetine (PROZAC) 10 MG capsule to the next level (20 mg) Pt feels she would benefit more w/ an increase. Pt has cpe on 7/18.  Will come in sooner if need. [facility]

## 2014-08-05 MED ORDER — FLUOXETINE HCL 20 MG PO TABS: 20.0000 mg | ORAL_TABLET | Freq: Every day | ORAL | Status: DC

## 2014-08-05 NOTE — Telephone Encounter (Signed)
Increase Prozac to 20 mg daily. Call in #30 with 2 rf

## 2014-08-05 NOTE — Telephone Encounter (Signed)
Rx sent to pharmacy and pt is aware. 

## 2014-08-09 ENCOUNTER — Ambulatory Visit: Payer: 59 | Admitting: Family Medicine

## 2014-08-30 ENCOUNTER — Encounter: Payer: 59 | Admitting: Family Medicine

## 2014-08-30 ENCOUNTER — Telehealth: Payer: Self-pay | Admitting: Family Medicine
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
## 2014-09-27 ENCOUNTER — Ambulatory Visit (INDEPENDENT_AMBULATORY_CARE_PROVIDER_SITE_OTHER): Payer: 59 | Admitting: Family Medicine

## 2014-09-27 ENCOUNTER — Encounter: Payer: Self-pay | Admitting: Family Medicine

## 2014-09-27 VITALS — BP 136/89 | HR 77 | Temp 99.1°F | Ht 69.0 in | Wt 182.0 lb

## 2014-09-27 DIAGNOSIS — Z0000 Encounter for general adult medical examination without abnormal findings: Secondary | ICD-10-CM | POA: Diagnosis not present

## 2014-09-27 MED ORDER — FLUOXETINE HCL 20 MG PO TABS: 20.0000 mg | ORAL_TABLET | Freq: Every day | ORAL | Status: DC

## 2014-09-27 MED ORDER — TEMAZEPAM 30 MG PO CAPS: 30.0000 mg | ORAL_CAPSULE | Freq: Every evening | ORAL | Status: DC | PRN

## 2014-09-27 NOTE — Progress Notes (Signed)
Pre visit review using our clinic review tool, if applicable. No additional management support is needed unless otherwise documented below in the visit note. 

## 2014-09-27 NOTE — Progress Notes (Signed)
   Subjective:    Patient ID: April Rivas, female    DOB: February 25, 1990, 24 y.o.   MRN: [id number]  HPI 24 yr old female for a cpx. She feels well and is pleased with the Prozac. Her moods have been stable but she has trouble sleeping.    Review of Systems  Constitutional: Negative.   HENT: Negative.   Eyes: Negative.   Respiratory: Negative.   Cardiovascular: Negative.   Gastrointestinal: Negative.   Genitourinary: Negative for dysuria, urgency, frequency, hematuria, flank pain, decreased urine volume, enuresis, difficulty urinating, pelvic pain and dyspareunia.  Musculoskeletal: Negative.   Skin: Negative.   Neurological: Negative.   Psychiatric/Behavioral: Positive for sleep disturbance. Negative for hallucinations, behavioral problems, confusion, dysphoric mood, decreased concentration and agitation. The patient is not nervous/anxious and is not hyperactive.        Objective:   Physical Exam  Constitutional: She is oriented to person, place, and time. She appears well-developed and well-nourished. No distress.  HENT:  Head: Normocephalic and atraumatic.  Right Ear: External ear normal.  Left Ear: External ear normal.  Nose: Nose normal.  Mouth/Throat: Oropharynx is clear and moist. No oropharyngeal exudate.  Eyes: Conjunctivae and EOM are normal. Pupils are equal, round, and reactive to light. No scleral icterus.  Neck: Normal range of motion. Neck supple. No JVD present. No thyromegaly present.  Cardiovascular: Normal rate, regular rhythm, normal heart sounds and intact distal pulses.  Exam reveals no gallop and no friction rub.   No murmur heard. Pulmonary/Chest: Effort normal and breath sounds normal. No respiratory distress. She has no wheezes. She has no rales. She exhibits no tenderness.  Abdominal: Soft. Bowel sounds are normal. She exhibits no distension and no mass. There is no tenderness. There is no rebound and no guarding.  Musculoskeletal: Normal range of  motion. She exhibits no edema or tenderness.  Lymphadenopathy:    She has no cervical adenopathy.  Neurological: She is alert and oriented to person, place, and time. She has normal reflexes. No cranial nerve deficit. She exhibits normal muscle tone. Coordination normal.  Skin: Skin is warm and dry. No rash noted. No erythema.  Psychiatric: She has a normal mood and affect. Her behavior is normal. Judgment and thought content normal.          Assessment & Plan:  Well exam. I encouraged her to exercise. Try Temazepam for sleep.  We discussed diet and exercise advice.

## 2014-10-05 ENCOUNTER — Telehealth: Payer: Self-pay | Admitting: Nurse Practitioner

## 2014-10-05 NOTE — Telephone Encounter (Signed)
Spoke with patient. Patient states that she has been monitoring her blood pressure and it has been increasing every time she checks it. States her BP is usually 150/60-70 range. When she was seen in our office on 07/01/2014 BP was 120/86. Was seen with PCP on 09/27/2014 and BP was 136/89. States she has not made any changes to her diet. Is not on any new medications. Feels this may be related to her Nuvaring. Is currently in her ring free week. Took her Nuvaring out on Sunday 10/03/2014. Patient has not checked her BP without her Nuvaring in. Patient is asking if she should remain off of her Nuvaring and monitor BP to see if this is the cause. Or if she should be seen to start a new birth control. Advised will speak with Lauro Franklin, FNP and return call with further recommendations.

## 2014-10-05 NOTE — Telephone Encounter (Signed)
Left message to call Zyasia Halbleib at [phone]. 

## 2014-10-05 NOTE — Telephone Encounter (Signed)
Patient calling requesting to speak with nurse about her contraception. She said, "My blood pressure has been steadily increasing so I'd like to see if that may be due to my birth control."

## 2014-10-05 NOTE — Telephone Encounter (Signed)
The Nuva ring or any combination of birth control pills can cause an elevated BP.  She could come off Nuva Ring for a month after the end of this ring and monitor BP regular and see if it consistently stayed up or down and record findings.  Then the next step is planning what kind of birth control she feels the most comfortable with - POP, IUD, Nexplanon, Depo Provera.  She must use BUM for birth control the month she is off the ring.

## 2014-10-06 NOTE — Telephone Encounter (Signed)
Left message to call Aqueelah Cotrell at [phone]. 

## 2014-10-06 NOTE — Telephone Encounter (Signed)
Spoke with patient. Advised of message as seen below from Lauro Franklin, FNP. Patient is agreeable and verbalizes understanding. Will not replace Nuvaring this month. Will monitor BP to ensure level is decreasing. If remains elevated aware she will need to see her PCP. Patient is agreeable. If monitors BP and it is decreasing and would like to start on a progesterone only birth control will return call.  Routing to provider for final review. Patient agreeable to disposition. Will close encounter.

## 2015-03-11 ENCOUNTER — Encounter: Payer: Self-pay | Admitting: Nurse Practitioner

## 2015-03-11 ENCOUNTER — Ambulatory Visit (INDEPENDENT_AMBULATORY_CARE_PROVIDER_SITE_OTHER): Payer: 59 | Admitting: Nurse Practitioner

## 2015-03-11 VITALS — BP 126/78 | HR 72 | Ht 69.0 in | Wt 195.0 lb

## 2015-03-11 DIAGNOSIS — Z3009 Encounter for other general counseling and advice on contraception: Secondary | ICD-10-CM | POA: Diagnosis not present

## 2015-03-11 MED ORDER — NORETHINDRONE 0.35 MG PO TABS: 1.0000 | ORAL_TABLET | Freq: Every day | ORAL | Status: DC

## 2015-03-11 NOTE — Progress Notes (Signed)
25 y.o. Single Caucasian female G0P0 here for follow up consultation of birth control.   On Nuva ring her BP was elevated.   Now off the Nuva ring since 09/2014. Menses off birth control is regular and lasting 4-6 days.  Slight increase in cramps.  Some breast tenderness, no increase in PMS.  LMP: 1/317.   Not SA ever.  Some increase in HA's.  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   O: Healthy WD,WN female Affect: normal   A: Wants other options for birth control - Nexplanon is her preference  Elevated BP on Nuva Ring    P:  Discussed other POP options with [employer] - she is OK with that but preference is Nexplanon.  I discussed need to have consult visit first and since menses is due next week - to get both done in a short time may not be possible.  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  She still wants it now.  She wants to make sure she is covered for birth control before she goes on her trip.   Labs :  none  Instructions given regarding: Nexplanon and insurance benefit sheet with codes  RV

## 2015-03-11 NOTE — Patient Instructions (Addendum)
Etonogestrel implant What is this medicine? ETONOGESTREL (et oh noe JES trel) is a contraceptive (birth control) device. It is used to prevent pregnancy. It can be used for up to 3 years. This medicine may be used for other purposes; ask your health care provider or pharmacist if you have questions. What should I tell my health care provider before I take this medicine? They need to know if you have any of these conditions: -abnormal vaginal bleeding -blood vessel disease or blood clots -cancer of the breast, cervix, or liver -depression -diabetes -gallbladder disease -headaches -heart disease or recent heart attack -high blood pressure -high cholesterol -kidney disease -liver disease -renal disease -seizures -tobacco smoker -an unusual or allergic reaction to etonogestrel, other hormones, anesthetics or antiseptics, medicines, foods, dyes, or preservatives -pregnant or trying to get pregnant -breast-feeding How should I use this medicine? This device is inserted just under the skin on the inner side of your upper arm by a health care professional. Talk to your pediatrician regarding the use of this medicine in children. Special care may be needed. Overdosage: If you think you have taken too much of this medicine contact a poison control center or emergency room at once. NOTE: This medicine is only for you. Do not share this medicine with others. What if I miss a dose? This does not apply. What may interact with this medicine? Do not take this medicine with any of the following medications: -amprenavir -bosentan -fosamprenavir This medicine may also interact with the following medications: -barbiturate medicines for inducing sleep or treating seizures -certain medicines for fungal infections like ketoconazole and itraconazole -griseofulvin -medicines to treat seizures like carbamazepine, felbamate, oxcarbazepine, phenytoin,  topiramate -modafinil -phenylbutazone -rifampin -some medicines to treat HIV infection like atazanavir, indinavir, lopinavir, nelfinavir, tipranavir, ritonavir -St. John's wort This list may not describe all possible interactions. Give your health care provider a list of all the medicines, herbs, non-prescription drugs, or dietary supplements you use. Also tell them if you smoke, drink alcohol, or use illegal drugs. Some items may interact with your medicine. What should I watch for while using this medicine? This product does not protect you against HIV infection (AIDS) or other sexually transmitted diseases. You should be able to feel the implant by pressing your fingertips over the skin where it was inserted. Contact your doctor if you cannot feel the implant, and use a non-hormonal birth control method (such as condoms) until your doctor confirms that the implant is in place. If you feel that the implant may have broken or become bent while in your arm, contact your healthcare provider. What side effects may I notice from receiving this medicine? Side effects that you should report to your doctor or health care professional as soon as possible: -allergic reactions like skin rash, itching or hives, swelling of the face, lips, or tongue -breast lumps -changes in emotions or moods -depressed mood -heavy or prolonged menstrual bleeding -pain, irritation, swelling, or bruising at the insertion site -scar at site of insertion -signs of infection at the insertion site such as fever, and skin redness, pain or discharge -signs of pregnancy -signs and symptoms of a blood clot such as breathing problems; changes in vision; chest pain; severe, sudden headache; pain, swelling, warmth in the leg; trouble speaking; sudden numbness or weakness of the face, arm or leg -signs and symptoms of liver injury like dark yellow or brown urine; general ill feeling or flu-like symptoms; light-colored stools; loss of  appetite; nausea; right upper belly   pain; unusually weak or tired; yellowing of the eyes or skin -unusual vaginal bleeding, discharge -signs and symptoms of a stroke like changes in vision; confusion; trouble speaking or understanding; severe headaches; sudden numbness or weakness of the face, arm or leg; trouble walking; dizziness; loss of balance or coordination Side effects that usually do not require medical attention (Report these to your doctor or health care professional if they continue or are bothersome.): -acne -back pain -breast pain -changes in weight -dizziness -general ill feeling or flu-like symptoms -headache -irregular menstrual bleeding -nausea -sore throat -vaginal irritation or inflammation This list may not describe all possible side effects. Call your doctor for medical advice about side effects. You may report side effects to FDA at 1-800-FDA-1088. Where should I keep my medicine? This drug is given in a hospital or clinic and will not be stored at home. NOTE: This sheet is a summary. It may not cover all possible information. If you have questions about this medicine, talk to your doctor, pharmacist, or health care provider.    2016, Elsevier/Gold Standard. (2013-11-13 14:07:06)   Norethindrone tablets (contraception) What is this medicine? NORETHINDRONE (nor eth IN drone) is an oral contraceptive. The product contains a female hormone known as a progestin. It is used to prevent pregnancy. This medicine may be used for other purposes; ask your health care provider or pharmacist if you have questions. What should I tell my health care provider before I take this medicine? They need to know if you have any of these conditions: -blood vessel disease or blood clots -breast, cervical, or vaginal cancer -diabetes -heart disease -kidney disease -liver disease -mental depression -migraine -seizures -stroke -vaginal bleeding -an unusual or allergic reaction to  norethindrone, other medicines, foods, dyes, or preservatives -pregnant or trying to get pregnant -breast-feeding How should I use this medicine? Take this medicine by mouth with a glass of water. You may take it with or without food. Follow the directions on the prescription label. Take this medicine at the same time each day and in the order directed on the package. Do not take your medicine more often than directed. Contact your pediatrician regarding the use of this medicine in children. Special care may be needed. This medicine has been used in female children who have started having menstrual periods. A patient package insert for the product will be given with each prescription and refill. Read this sheet carefully each time. The sheet may change frequently. Overdosage: If you think you have taken too much of this medicine contact a poison control center or emergency room at once. NOTE: This medicine is only for you. Do not share this medicine with others. What if I miss a dose? Try not to miss a dose. Every time you miss a dose or take a dose late your chance of pregnancy increases. When 1 pill is missed (even if only 3 hours late), take the missed pill as soon as possible and continue taking a pill each day at the regular time (use a back up method of birth control for the next 48 hours). If more than 1 dose is missed, use an additional birth control method for the rest of your pill pack until menses occurs. Contact your health care professional if more than 1 dose has been missed. What may interact with this medicine? Do not take this medicine with any of the following medications: -amprenavir or fosamprenavir -bosentan This medicine may also interact with the following medications: -antibiotics or medicines for infections,  especially rifampin, rifabutin, rifapentine, and griseofulvin, and possibly penicillins or tetracyclines -aprepitant -barbiturate medicines, such as  phenobarbital -carbamazepine -felbamate -modafinil -oxcarbazepine -phenytoin -ritonavir or other medicines for HIV infection or AIDS -St. John's wort -topiramate This list may not describe all possible interactions. Give your health care provider a list of all the medicines, herbs, non-prescription drugs, or dietary supplements you use. Also tell them if you smoke, drink alcohol, or use illegal drugs. Some items may interact with your medicine. What should I watch for while using this medicine? Visit your doctor or health care professional for regular checks on your progress. You will need a regular breast and pelvic exam and Pap smear while on this medicine. Use an additional method of birth control during the first cycle that you take these tablets. If you have any reason to think you are pregnant, stop taking this medicine right away and contact your doctor or health care professional. If you are taking this medicine for hormone related problems, it may take several cycles of use to see improvement in your condition. This medicine does not protect you against HIV infection (AIDS) or any other sexually transmitted diseases. What side effects may I notice from receiving this medicine? Side effects that you should report to your doctor or health care professional as soon as possible: -breast tenderness or discharge -pain in the abdomen, chest, groin or leg -severe headache -skin rash, itching, or hives -sudden shortness of breath -unusually weak or tired -vision or speech problems -yellowing of skin or eyes Side effects that usually do not require medical attention (report to your doctor or health care professional if they continue or are bothersome): -changes in sexual desire -change in menstrual flow -facial hair growth -fluid retention and swelling -headache -irritability -nausea -weight gain or loss This list may not describe all possible side effects. Call your doctor for  medical advice about side effects. You may report side effects to FDA at 1-800-FDA-1088. Where should I keep my medicine? Keep out of the reach of children. Store at room temperature between 15 and 30 degrees C (59 and 86 degrees F). Throw away any unused medicine after the expiration date. NOTE: This sheet is a summary. It may not cover all possible information. If you have questions about this medicine, talk to your doctor, pharmacist, or health care provider.    2016, Elsevier/Gold Standard. (2011-10-19 16:41:35)

## 2015-03-14 ENCOUNTER — Ambulatory Visit (INDEPENDENT_AMBULATORY_CARE_PROVIDER_SITE_OTHER): Payer: 59 | Admitting: Obstetrics and Gynecology

## 2015-03-14 ENCOUNTER — Encounter: Payer: Self-pay | Admitting: Obstetrics and Gynecology

## 2015-03-14 VITALS — BP 110/70 | HR 80 | Resp 14 | Wt 194.0 lb

## 2015-03-14 DIAGNOSIS — Z3009 Encounter for other general counseling and advice on contraception: Secondary | ICD-10-CM | POA: Diagnosis not present

## 2015-03-14 NOTE — Progress Notes (Signed)
Patient ID: April Rivas, female   DOB: 1990-03-05, 25 y.o.   MRN: [id number] GYNECOLOGY  VISIT   HPI: 25 y.o.   Single  Caucasian  female   G0P0 with Patient's last menstrual period was 03/13/2015.   here to discuss Nexplanon. She was using the nuvaring, went off secondary to elevated BP, she was having mood changes as well. She hasn't been sexually active (ever), wants to have protection just in case. She just started the minipill, is interested in the nexplanon. She has a h/o severe cramps, better since being on and off the nuvaring, currently tolerable.  She went off the nuvaring 5 months. Menses q month x 5 days, fills a menstrual cup up to 3 x a day at worst. Cramps are currently tolerable, helped with alieve.    GYNECOLOGIC HISTORY: Patient's last menstrual period was 03/13/2015. Contraception:OCP Menopausal hormone therapy: None        OB History    Gravida Para Term Preterm AB TAB SAB Ectopic Multiple Living   0                  Patient Active Problem List   Diagnosis Date Noted  . Depression with anxiety 03/01/2014  . ANKLE PAIN, LEFT 07/20/2009  . ROTATOR CUFF INJURY, LEFT SHOULDER 07/20/2009  . OTHER ACQUIRED DEFORMITY OF ANKLE AND FOOT OTHER 07/20/2009  . AMENORRHEA, PRIMARY 09/10/2008  . CERVICAL LYMPHADENOPATHY 09/10/2008    Past Medical History  Diagnosis Date  . IBS (irritable bowel syndrome)   . Anemia   . Depression   . Anxiety     History reviewed. No pertinent past surgical history.  Current Outpatient Prescriptions  Medication Sig Dispense Refill  . albuterol (PROVENTIL HFA;VENTOLIN HFA) 108 (90 BASE) MCG/ACT inhaler Inhale 2 puffs into the lungs every 4 (four) hours as needed for wheezing or shortness of breath. 1 Inhaler 5  . FLUoxetine (PROZAC) 20 MG tablet Take 1 tablet (20 mg total) by mouth daily. 90 tablet 3  . Multiple Vitamins-Minerals (MULTIVITAMIN PO) Take by mouth daily.    . Naproxen Sod-Diphenhydramine (ALEVE PM PO) Take by mouth.     . norethindrone (MICRONOR,CAMILA,ERRIN) 0.35 MG tablet Take 1 tablet (0.35 mg total) by mouth daily. 3 Package 0  . temazepam (RESTORIL) 30 MG capsule Reported on 03/14/2015     No current facility-administered medications for this visit.     ALLERGIES: Benzoyl peroxide  Family History  Problem Relation Age of Onset  . Depression    . Melanoma      PGF  . Hypertension      Maternal Side   . Anemia Mother   . Hypertension Mother   . Diabetes Mellitus II Paternal Grandmother 8  . Breast cancer Paternal Grandmother 62    lumpectomy and 2 months of chemo  . Heart failure Maternal Grandfather   . Heart attack Maternal Grandfather   . Stroke Maternal Grandmother   . Diabetes Paternal Grandfather   . Stroke Maternal Aunt   . Heart attack Maternal Aunt     Social History   Social History  . Marital Status: Single    Spouse Name: N/A  . Number of Children: N/A  . Years of Education: N/A   Occupational History  . Not on file.   Social History Main Topics  . Smoking status: Never Smoker   . Smokeless tobacco: Never Used  . Alcohol Use: 0.0 oz/week    0 Standard drinks or equivalent per week  Comment: occ  . Drug Use: No  . Sexual Activity: No   Other Topics Concern  . Not on file   Social History Narrative    Review of Systems  Constitutional: Negative.   HENT: Negative.   Eyes: Negative.   Respiratory: Negative.   Cardiovascular: Negative.   Gastrointestinal: Negative.   Genitourinary: Negative.   Musculoskeletal: Negative.   Skin: Negative.   Neurological: Negative.   Endo/Heme/Allergies: Negative.   Psychiatric/Behavioral: Negative.     PHYSICAL EXAMINATION:    BP 110/70 mmHg  Pulse 80  Resp 14  Wt 194 lb (87.998 kg)  LMP 03/13/2015    General appearance: alert, cooperative and appears stated age  ASSESSMENT Contraception management. She developed elevated BP and mood changes on the nuvaring. She hasn't been sexually active yet, but wants to  be protected  Dysmenorrhea, currently tolerable with over the counter medication She reports a h/o anemia, normal CBC last year, declines testing today.     PLAN Discussed options of the micronor (she just started it), depo-provera, the nexplanon and IUD's.  Given her h/o heavy, crampy cycles would recommend the mirena for her, if she choose an IUD. 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 She is hesitant to do anything so close to when she is leaving   An After Visit Summary was printed and given to the patient.  20 minutes face to face time of which over 50% was spent in counseling.

## 2015-03-14 NOTE — Progress Notes (Signed)
Encounter reviewed by Dr. Janean Sark. BP 126/78.

## 2015-03-16 ENCOUNTER — Telehealth: Payer: Self-pay | Admitting: Nurse Practitioner

## 2015-03-16 MED ORDER — NORETHINDRONE 0.35 MG PO TABS: 1.0000 | ORAL_TABLET | Freq: Every day | ORAL | Status: DC

## 2015-03-16 NOTE — Telephone Encounter (Signed)
Patient calling to see if Ms April Rivas will authorize 1 more pack of birth control pills for her because she will be leaving to go out of the country on the 20th.

## 2015-03-16 NOTE — Telephone Encounter (Signed)
Patient was seen in the office on 03/14/2015 for consultation regarding Nexplanon with Dr.Jertson. She is currently taking Micronor. Per OV from Dr.Jertson she was unsure what she would like to do as she will be leaving for [place] soon (see note below). The patient is requesting an additional pack of Micronor be sent to the pharmacy so that she will have enough birth control until she returns from [place]. Rx for Micronor #1 0RF sent to pharmacy on file. Patient has been notified.  PLAN Discussed options of the micronor (she just started it), depo-provera, the nexplanon and IUD's.  Given her h/o heavy, crampy cycles would recommend the mirena for her, if she choose an IUD. 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 She is hesitant to do anything so close to when she is leaving  An After Visit Summary was printed and given to the patient.  20 minutes face to face time of which over 50% was spent in counseling.    Routing to provider for final review. Patient agreeable to disposition. Will close encounter.

## 2015-04-01 ENCOUNTER — Ambulatory Visit: Payer: 59 | Admitting: Nurse Practitioner

## 2015-04-01 ENCOUNTER — Telehealth: Payer: Self-pay | Admitting: Nurse Practitioner

## 2015-04-01 NOTE — Telephone Encounter (Signed)
Refused refill due to medication being filled on 03/16/15. MPF

## 2015-04-01 NOTE — Telephone Encounter (Signed)
Patient called back she said the travel override was approved by her insurance company she is going to head to the pharmacy and let them know all the information.

## 2015-04-01 NOTE — Telephone Encounter (Signed)
Patient called she is needing [place] to authorize a additional refill even though we sent one in on the 2nd her insurance is not allowing her to get it. Patient says she will be one month short and is leaving Monday. Best # to reach: [phone]

## 2015-04-01 NOTE — Telephone Encounter (Signed)
Spoke with patient and let her know that she would need to contact her insurance company to let them know she needs a travel override to complete refill. I instructed her to call our office back and let us know what the outcome was so we could move forward with plan. Patient was agreeable to this.

## 2015-06-27 ENCOUNTER — Ambulatory Visit (INDEPENDENT_AMBULATORY_CARE_PROVIDER_SITE_OTHER): Payer: 59 | Admitting: Nurse Practitioner

## 2015-06-27 ENCOUNTER — Encounter: Payer: Self-pay | Admitting: Nurse Practitioner

## 2015-06-27 VITALS — BP 120/74 | HR 88 | Ht 67.25 in | Wt 180.0 lb

## 2015-06-27 DIAGNOSIS — Z0000 Encounter for general adult medical examination without abnormal findings: Secondary | ICD-10-CM

## 2015-06-27 DIAGNOSIS — F411 Generalized anxiety disorder: Secondary | ICD-10-CM

## 2015-06-27 DIAGNOSIS — Z01419 Encounter for gynecological examination (general) (routine) without abnormal findings: Secondary | ICD-10-CM

## 2015-06-27 LAB — POCT URINALYSIS DIPSTICK
BILIRUBIN UA: NEGATIVE
Blood, UA: NEGATIVE
GLUCOSE UA: NEGATIVE
Ketones, UA: NEGATIVE
Leukocytes, UA: NEGATIVE
Nitrite, UA: NEGATIVE
Protein, UA: NEGATIVE
UROBILINOGEN UA: NEGATIVE
pH, UA: 6

## 2015-06-27 NOTE — Patient Instructions (Signed)
General topics  Next pap or exam is  due in 1 year Take a Women's multivitamin Take 1200 mg. of calcium daily - prefer dietary If any concerns in interim to call back  Breast Self-Awareness Practicing breast self-awareness may pick up problems early, prevent significant medical complications, and possibly save your life. By practicing breast self-awareness, you can become familiar with how your breasts look and feel and if your breasts are changing. This allows you to notice changes early. It can also offer you some reassurance that your breast health is good. One way to learn what is normal for your breasts and whether your breasts are changing is to do a breast self-exam. If you find a lump or something that was not present in the past, it is best to contact your caregiver right away. Other findings that should be evaluated by your caregiver include nipple discharge, especially if it is bloody; skin changes or reddening; areas where the skin seems to be pulled in (retracted); or new lumps and bumps. Breast pain is seldom associated with cancer (malignancy), but should also be evaluated by a caregiver. BREAST SELF-EXAM The best time to examine your breasts is 5 7 days after your menstrual period is over.  ExitCare Patient Information 2013 [employer].   Exercise to Stay Healthy Exercise helps you become and stay healthy. EXERCISE IDEAS AND TIPS Choose exercises that:  You enjoy.  Fit into your day. You do not need to exercise really hard to be healthy. You can do exercises at a slow or medium level and stay healthy. You can:  Stretch before and after working out.  Try yoga, Pilates, or tai chi.  Lift weights.  Walk fast, swim, jog, run, climb stairs, bicycle, dance, or rollerskate.  Take aerobic classes. Exercises that burn about 150 calories:  Running 1  miles in 15 minutes.  Playing volleyball for 45 to 60 minutes.  Washing and waxing a car for 45 to 60  minutes.  Playing touch football for 45 minutes.  Walking 1  miles in 35 minutes.  Pushing a stroller 1  miles in 30 minutes.  Playing basketball for 30 minutes.  Raking leaves for 30 minutes.  Bicycling 5 miles in 30 minutes.  Walking 2 miles in 30 minutes.  Dancing for 30 minutes.  Shoveling snow for 15 minutes.  Swimming laps for 20 minutes.  Walking up stairs for 15 minutes.  Bicycling 4 miles in 15 minutes.  Gardening for 30 to 45 minutes.  Jumping rope for 15 minutes.  Washing windows or floors for 45 to 60 minutes. Document Released: 03/03/2010 Document Revised: 04/23/2011 Document Reviewed: 03/03/2010 ExitCare Patient Information 2013 [employer].   Other topics ( that may be useful information):    Sexually Transmitted Disease Sexually transmitted disease (STD) refers to any infection that is passed from person to person during sexual activity. This may happen by way of saliva, semen, blood, vaginal mucus, or urine. Common STDs include:  Gonorrhea.  Chlamydia.  Syphilis.  HIV/AIDS.  Genital herpes.  Hepatitis B and C.  Trichomonas.  Human papillomavirus (HPV).  Pubic lice. CAUSES  An STD may be spread by bacteria, virus, or parasite. A person can get an STD by:  Sexual intercourse with an infected person.  Sharing sex toys with an infected person.  Sharing needles with an infected person.  Having intimate contact with the genitals, mouth, or rectal areas of an infected person. SYMPTOMS  Some people may not have any symptoms, but   they can still pass the infection to others. Different STDs have different symptoms. Symptoms include:  Painful or bloody urination.  Pain in the pelvis, abdomen, vagina, anus, throat, or eyes.  Skin rash, itching, irritation, growths, or sores (lesions). These usually occur in the genital or anal area.  Abnormal vaginal discharge.  Penile discharge in men.  Soft, flesh-colored skin growths in the  genital or anal area.  Fever.  Pain or bleeding during sexual intercourse.  Swollen glands in the groin area.  Yellow skin and eyes (jaundice). This is seen with hepatitis. DIAGNOSIS  To make a diagnosis, your caregiver may:  Take a medical history.  Perform a physical exam.  Take a specimen (culture) to be examined.  Examine a sample of discharge under a microscope.  Perform blood test TREATMENT   Chlamydia, gonorrhea, trichomonas, and syphilis can be cured with antibiotic medicine.  Genital herpes, hepatitis, and HIV can be treated, but not cured, with prescribed medicines. The medicines will lessen the symptoms.  Genital warts from HPV can be treated with medicine or by freezing, burning (electrocautery), or surgery. Warts may come back.  HPV is a virus and cannot be cured with medicine or surgery.However, abnormal areas may be followed very closely by your caregiver and may be removed from the cervix, vagina, or vulva through office procedures or surgery. If your diagnosis is confirmed, your recent sexual partners need treatment. This is true even if they are symptom-free or have a negative culture or evaluation. They should not have sex until their caregiver says it is okay. HOME CARE INSTRUCTIONS  All sexual partners should be informed, tested, and treated for all STDs.  Take your antibiotics as directed. Finish them even if you start to feel better.  Only take over-the-counter or prescription medicines for pain, discomfort, or fever as directed by your caregiver.  Rest.  Eat a balanced diet and drink enough fluids to keep your urine clear or pale yellow.  Do not have sex until treatment is completed and you have followed up with your caregiver. STDs should be checked after treatment.  Keep all follow-up appointments, Pap tests, and blood tests as directed by your caregiver.  Only use latex condoms and water-soluble lubricants during sexual activity. Do not use  petroleum jelly or oils.  Avoid alcohol and illegal drugs.  Get vaccinated for HPV and hepatitis. If you have not received these vaccines in the past, talk to your caregiver about whether one or both might be right for you.  Avoid risky sex practices that can break the skin. The only way to avoid getting an STD is to avoid all sexual activity.Latex condoms and dental dams (for oral sex) will help lessen the risk of getting an STD, but will not completely eliminate the risk. SEEK MEDICAL CARE IF:   You have a fever.  You have any new or worsening symptoms. Document Released: 04/21/2002 Document Revised: 04/23/2011 Document Reviewed: 04/28/2010 [facility] Patient Information 2013 [employer].    Domestic Abuse You are being battered or abused if someone close to you hits, pushes, or physically hurts you in any way. You also are being abused if you are forced into activities. You are being sexually abused if you are forced to have sexual contact of any kind. You are being emotionally abused if you are made to feel worthless or if you are constantly threatened. It is important to remember that help is available. No one has the right to abuse you. PREVENTION OF FURTHER  ABUSE  Learn the warning signs of danger. This varies with situations but may include: the use of alcohol, threats, isolation from friends and family, or forced sexual contact. Leave if you feel that violence is going to occur.  If you are attacked or beaten, report it to the police so the abuse is documented. You do not have to press charges. The police can protect you while you or the attackers are leaving. Get the officer's name and badge number and a copy of the report.  Find someone you can trust and tell them what is happening to you: your caregiver, a nurse, clergy member, close friend or family member. Feeling ashamed is natural, but remember that you have done nothing wrong. No one deserves abuse. Document Released:  01/27/2000 Document Revised: 04/23/2011 Document Reviewed: 04/06/2010 ExitCare Patient Information 2013 [employer]Care[employer].    How Much is Too Much Alcohol? Drinking too much alcohol can cause injury, accidents, and health problems. These types of problems can include:   Car crashes.  Falls.  Family fighting (domestic violence).  Drowning.  Fights.  Injuries.  Burns.  Damage to certain organs.  Having a baby with birth defects. ONE DRINK CAN BE TOO MUCH WHEN YOU ARE:  Working.  Pregnant or breastfeeding.  Taking medicines. Ask your doctor.  Driving or planning to drive. If you or someone you know has a drinking problem, get help from a doctor.  Document Released: 11/25/2008 Document Revised: 04/23/2011 Document Reviewed: 11/25/2008 ExitCare Patient Information 2013 [employer]Care[employer].   Smoking Hazards Smoking cigarettes is extremely bad for your health. Tobacco smoke has over 200 known poisons in it. There are over 60 chemicals in tobacco smoke that cause cancer. Some of the chemicals found in cigarette smoke include:   Cyanide.  Benzene.  Formaldehyde.  Methanol (wood alcohol).  Acetylene (fuel used in welding torches).  Ammonia. Cigarette smoke also contains the poisonous gases nitrogen oxide and carbon monoxide.  Cigarette smokers have an increased risk of many serious medical problems and Smoking causes approximately:  90% of all lung cancer deaths in men.  80% of all lung cancer deaths in women.  90% of deaths from chronic obstructive lung disease. Compared with nonsmokers, smoking increases the risk of:  Coronary heart disease by 2 to 4 times.  Stroke by 2 to 4 times.  Men developing lung cancer by 23 times.  Women developing lung cancer by 13 times.  Dying from chronic obstructive lung diseases by 12 times.  . Smoking is the most preventable cause of death and disease in our society.  WHY IS SMOKING ADDICTIVE?  Nicotine is the chemical  agent in tobacco that is capable of causing addiction or dependence.  When you smoke and inhale, nicotine is absorbed rapidly into the bloodstream through your lungs. Nicotine absorbed through the lungs is capable of creating a powerful addiction. Both inhaled and non-inhaled nicotine may be addictive.  Addiction studies of cigarettes and spit tobacco show that addiction to nicotine occurs mainly during the teen years, when young people begin using tobacco products. WHAT ARE THE BENEFITS OF QUITTING?  There are many health benefits to quitting smoking.   Likelihood of developing cancer and heart disease decreases. Health improvements are seen almost immediately.  Blood pressure, pulse rate, and breathing patterns start returning to normal soon after quitting. QUITTING SMOKING   American Lung Association - 1-800-LUNGUSA  American Cancer Society - 1-800-ACS-2345 Document Released: 03/08/2004 Document Revised: 04/23/2011 Document Reviewed: 11/10/2008 ExitCare Patient Information 2013 ExitCare,   LLC.   Stress Management Stress is a state of physical or mental tension that often results from changes in your life or normal routine. Some common causes of stress are:  Death of a loved one.  Injuries or severe illnesses.  Getting fired or changing jobs.  Moving into a new home. Other causes may be:  Sexual problems.  Business or financial losses.  Taking on a large debt.  Regular conflict with someone at home or at work.  Constant tiredness from lack of sleep. It is not just bad things that are stressful. It may be stressful to:  Win the lottery.  Get married.  Buy a new car. The amount of stress that can be easily tolerated varies from person to person. Changes generally cause stress, regardless of the types of change. Too much stress can affect your health. It may lead to physical or emotional problems. Too little stress (boredom) may also become stressful. SUGGESTIONS TO  REDUCE STRESS:  Talk things over with your family and friends. It often is helpful to share your concerns and worries. If you feel your problem is serious, you may want to get help from a professional counselor.  Consider your problems one at a time instead of lumping them all together. Trying to take care of everything at once may seem impossible. List all the things you need to do and then start with the most important one. Set a goal to accomplish 2 or 3 things each day. If you expect to do too many in a single day you will naturally fail, causing you to feel even more stressed.  Do not use alcohol or drugs to relieve stress. Although you may feel better for a short time, they do not remove the problems that caused the stress. They can also be habit forming.  Exercise regularly - at least 3 times per week. Physical exercise can help to relieve that "uptight" feeling and will relax you.  The shortest distance between despair and hope is often a good night's sleep.  Go to bed and get up on time allowing yourself time for appointments without being rushed.  Take a short "time-out" period from any stressful situation that occurs during the day. Close your eyes and take some deep breaths. Starting with the muscles in your face, tense them, hold it for a few seconds, then relax. Repeat this with the muscles in your neck, shoulders, hand, stomach, back and legs.  Take good care of yourself. Eat a balanced diet and get plenty of rest.  Schedule time for having fun. Take a break from your daily routine to relax. HOME CARE INSTRUCTIONS   Call if you feel overwhelmed by your problems and feel you can no longer manage them on your own.  Return immediately if you feel like hurting yourself or someone else. Document Released: 07/25/2000 Document Revised: 04/23/2011 Document Reviewed: 03/17/2007 ExitCare Patient Information 2013 ExitCare, LLC.  

## 2015-06-27 NOTE — Progress Notes (Signed)
Patient ID: [place], female   DOB: Feb 27, 1990, 25 y.o.   MRN: [id number]  25 y.o. [id number] Single  Caucasian Fe here for annual exam. Menses is regular until this last one, which was a little late.  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   While she was gone she was eating healthier and now taking up running.  Dating but not SA ever.  Will graduate next May in  [place].  Not taking Micronor this past month as she was traveling and unable to get med's.  She has been counseled about IUD from Dr. Oscar La and she really does not want amenorrhea on [place].  She wants the [place] IUD instead and wants to plan [place] IUD this week if menses starts as it should.  Patient's last menstrual period was 06/07/2015 (exact date).          Sexually active: No. Never SA. The current method of family planning is abstinence.    Exercising: Yes.    running, yoga, spin, weights and swimming Smoker:  no  Health Maintenance: Pap:  03/30/14, Negative  TDaP:  2012 HIV: not done at this time  Labs: Urine: negative   reports that she has never smoked. She has never used smokeless tobacco. She reports that she drinks alcohol. She reports that she does not use illicit drugs.  Past Medical History  Diagnosis Date  . IBS (irritable bowel syndrome)   . Anemia   . Depression   . Anxiety     History reviewed. No pertinent past surgical history.  Current Outpatient Prescriptions  Medication Sig Dispense Refill  . albuterol (PROVENTIL HFA;VENTOLIN HFA) 108 (90 BASE) MCG/ACT inhaler Inhale 2 puffs into the lungs every 4 (four) hours as needed for wheezing or shortness of breath. 1 Inhaler 5  . FLUoxetine (PROZAC) 20 MG tablet Take 1 tablet (20 mg total) by mouth daily. 90 tablet 3  . Multiple Vitamins-Minerals (MULTIVITAMIN PO) Take by mouth daily.    . Naproxen Sod-Diphenhydramine (ALEVE PM PO) Take by mouth.    . norethindrone (MICRONOR,CAMILA,ERRIN) 0.35 MG tablet Take 1 tablet (0.35  mg total) by mouth daily. 1 Package 0  . temazepam (RESTORIL) 30 MG capsule Reported on 03/14/2015     No current facility-administered medications for this visit.    Family History  Problem Relation Age of Onset  . Depression    . Melanoma      PGF  . Hypertension      Maternal Side   . Anemia Mother   . Hypertension Mother   . Diabetes Mellitus II Paternal Grandmother 26  . Breast cancer Paternal Grandmother 96    lumpectomy and 2 months of chemo  . Heart failure Maternal Grandfather   . Heart attack Maternal Grandfather   . Stroke Maternal Grandmother   . Diabetes Paternal Grandfather   . Stroke Maternal Aunt   . Heart attack Maternal Aunt     ROS:  Pertinent items are noted in HPI.  Otherwise, a comprehensive ROS was negative.  Exam:   BP 120/74 mmHg  Pulse 88  Ht 5' 7.25" (1.708 m)  Wt 180 lb (81.647 kg)  BMI 27.99 kg/m2  LMP 06/07/2015 (Exact Date) Height: 5' 7.25" (170.8 cm) Ht Readings from Last 3 Encounters:  06/27/15 5' 7.25" (1.708 m)  03/11/15  (1.753 m)  09/27/14  (1.753 m)    General appearance: alert, cooperative and appears stated age Head: Normocephalic, without obvious  abnormality, atraumatic Neck: no adenopathy, supple, symmetrical, trachea midline and thyroid normal to inspection and palpation Lungs: clear to auscultation bilaterally Breasts: normal appearance, no masses or tenderness Heart: regular rate and rhythm Abdomen: soft, non-tender; no masses,  no organomegaly Extremities: extremities normal, atraumatic, no cyanosis or edema Skin: Skin color, texture, turgor normal. No rashes or lesions Lymph nodes: Cervical, supraclavicular, and axillary nodes normal. No abnormal inguinal nodes palpated Neurologic: Grossly normal   Pelvic: External genitalia:  no lesions              Urethra:  normal appearing urethra with no masses, tenderness or lesions              Bartholin's and Skene's: normal                 Vagina: normal  appearing vagina with normal color and discharge, no lesions              Cervix: anteverted              Pap taken: No. Bimanual Exam:  Uterus:  normal size, contour, position, consistency, mobility, non-tender              Adnexa: no mass, fullness, tenderness               Rectovaginal: Confirms               Anus:  normal sphincter tone, no lesions  Chaperone present: no  A:  Well Woman with normal exam  Normal menses, never SA History of FCB History of acne, depression and anxiety  Elevated BP on OCP, Nuva Ring Desires contraception for future use   P:   Reviewed health and wellness pertinent to exam  Pap smear as above  She will call [place] this week with onset of menses for [place] IUD - will send message to [place].  Counseled on breast self exam, STD prevention, HIV risk factors and prevention, family planning choices, adequate intake of calcium and vitamin D, diet and exercise return annually or prn  An After Visit Summary was printed and given to the patient.

## 2015-06-30 ENCOUNTER — Telehealth: Payer: Self-pay | Admitting: Nurse Practitioner

## 2015-06-30 DIAGNOSIS — Z975 Presence of (intrauterine) contraceptive device: Secondary | ICD-10-CM

## 2015-06-30 MED ORDER — MISOPROSTOL 200 MCG PO TABS: ORAL_TABLET | ORAL | Status: DC

## 2015-06-30 NOTE — Telephone Encounter (Signed)
Patient is calling to schedule an IUD insertion she is on her cycle now.

## 2015-06-30 NOTE — Telephone Encounter (Signed)
Noted message from Dr. Oscar LaJertson.  Will close encounter.

## 2015-06-30 NOTE — Telephone Encounter (Signed)
She can have the skyla if she prefers

## 2015-06-30 NOTE — Telephone Encounter (Signed)
Notes reviewed from prior office visit with Ria CommentPatricia Grubb, FNP 06/27/15:  She has been counseled about IUD from Dr. Oscar LaJertson and she really does not want amenorrhea on Mirena. She wants the [facility] I[facility] instead and wants to plan Christ[facility] GriefSkyla IUD this week if menses starts as it should.  Spoke with patient. She started her cycle today. Requests Tuesday appointment if possible. She would like to have Skyla and was advised that IUD is covered by her insurance.  Per Dr. Oscar LaJertson notes, recommended Mirena. Order placed to pre-cert both [place]yla and Mirena.   Pre procedure instructions given.  Motrin 800 mg one hour before appointment. Eat a meal and hydrate well before appointment. Use Cytotec 400 mcg PV 6 hours prior to appointment. Prescription sent to [facility] on file.   Patient verbalized understanding of instructions and is advised to call back with any concerns prior to appointment.  Routing to Dr. Oscar LaJertson to review.

## 2015-07-03 NOTE — Progress Notes (Signed)
Encounter reviewed by Dr. Peniel Biel Amundson C. Silva.  

## 2015-07-05 ENCOUNTER — Encounter: Payer: Self-pay | Admitting: Obstetrics and Gynecology

## 2015-07-05 ENCOUNTER — Ambulatory Visit (INDEPENDENT_AMBULATORY_CARE_PROVIDER_SITE_OTHER): Payer: 59 | Admitting: Obstetrics and Gynecology

## 2015-07-05 VITALS — BP 118/80 | HR 80 | Resp 15 | Wt 185.0 lb

## 2015-07-05 DIAGNOSIS — Z01812 Encounter for preprocedural laboratory examination: Secondary | ICD-10-CM | POA: Diagnosis not present

## 2015-07-05 DIAGNOSIS — Z975 Presence of (intrauterine) contraceptive device: Secondary | ICD-10-CM | POA: Diagnosis not present

## 2015-07-05 DIAGNOSIS — Z30430 Encounter for insertion of intrauterine contraceptive device: Secondary | ICD-10-CM

## 2015-07-05 HISTORY — PX: INTRAUTERINE DEVICE (IUD) INSERTION: SHX5877

## 2015-07-05 LAB — POCT URINE PREGNANCY: PREG TEST UR: NEGATIVE

## 2015-07-05 NOTE — Progress Notes (Signed)
Patient ID: April Rivas, female   DOB: 01/18/1991, 25 y.o.   MRN: [id number] GYNECOLOGY  VISIT   HPI: 25 y.o.   Single  Caucasian  female   G0P0000 with Patient's last menstrual period was 05/31/2015.   [facility]. Never sexually active.   GYNECOLOGIC HISTORY: Patient's last menstrual period was 05/31/2015. Contraception:none  Menopausal hormone therapy: none         OB History    Gravida Para Term Preterm AB TAB SAB Ectopic Multiple Living   0 0 0 0 0 0 0 0 0 0          Patient Active Problem List   Diagnosis Date Noted  . Depression with anxiety 03/01/2014  . ANKLE PAIN, LEFT 07/20/2009  . ROTATOR CUFF INJURY, LEFT SHOULDER 07/20/2009  . OTHER ACQUIRED DEFORMITY OF ANKLE AND FOOT OTHER 07/20/2009  . AMENORRHEA, PRIMARY 09/10/2008  . CERVICAL LYMPHADENOPATHY 09/10/2008    Past Medical History  Diagnosis Date  . IBS (irritable bowel syndrome)   . Anemia   . Depression   . Anxiety     History reviewed. No pertinent past surgical history.  Current Outpatient Prescriptions  Medication Sig Dispense Refill  . albuterol (PROVENTIL HFA;VENTOLIN HFA) 108 (90 BASE) MCG/ACT inhaler Inhale 2 puffs into the lungs every 4 (four) hours as needed for wheezing or shortness of breath. 1 Inhaler 5  . FLUoxetine (PROZAC) 20 MG tablet Take 1 tablet (20 mg total) by mouth daily. 90 tablet 3  . misoprostol (CYTOTEC) 200 MCG tablet Place two tablets PV 6 hours prior to IUD insertion. 2 tablet 0  . Multiple Vitamins-Minerals (MULTIVITAMIN PO) Take by mouth daily.    . Naproxen Sod-Diphenhydramine (ALEVE PM PO) Take by mouth.     No current facility-administered medications for this visit.     ALLERGIES: Benzoyl peroxide  Family History  Problem Relation Age of Onset  . Depression    . Melanoma      PGF  . Hypertension      Maternal Side   . Anemia Mother   . Hypertension Mother   . Diabetes Mellitus II Paternal Grandmother [id number]  . Breast cancer Paternal  Grandmother [id number]    lumpectomy and 2 months of chemo  . Heart failure Maternal Grandfather   . Heart attack Maternal Grandfather   . Stroke Maternal Grandmother   . Diabetes Paternal Grandfather   . Stroke Maternal Aunt   . Heart attack Maternal Aunt     Social History   Social History  . Marital Status: Single    Spouse Name: N/A  . Number of Children: N/A  . Years of Education: N/A   Occupational History  . Not on file.   Social History Main Topics  . Smoking status: Never Smoker   . Smokeless tobacco: Never Used  . Alcohol Use: 0.0 oz/week    0 Standard drinks or equivalent per week     Comment: occ  . Drug Use: No  . Sexual Activity: No   Other Topics Concern  . Not on file   Social History Narrative    Review of Systems  Constitutional: Negative.   HENT: Negative.   Eyes: Negative.   Respiratory: Negative.   Cardiovascular: Negative.   Gastrointestinal: Negative.   Genitourinary: Negative.   Musculoskeletal: Negative.   Skin: Negative.   Neurological: Negative.   Endo/Heme/Allergies: Negative.   Psychiatric/Behavioral: Negative.     PHYSICAL EXAMINATION:    BP 118/80 mmHg  Pulse  80  Resp 15  Wt 185 lb (83.915 kg)  LMP 05/31/2015    General appearance: alert, cooperative and appears stated age  Pelvic: External genitalia:  no lesions              Urethra:  normal appearing urethra with no masses, tenderness or lesions              Bartholins and Skenes: normal                 Vagina: normal appearing vagina with normal color and discharge, no lesions              Cervix: no lesions              Bimanual Exam:  Uterus:  normal size, contour, position, consistency, mobility, non-tender and anteverted              Adnexa: no mass, fullness, tenderness      The risks of the skyla IUD were reviewed with the patient, including infection, abnormal bleeding and uterine perfortion. Consent was signed.  A speculum was placed in the vagina, the cervix was  cleansed with betadine. A tenaculum was placed on the cervix. The skyla IUD was inserted without difficulty. The uterus sounded to 6-7 cm. The string were cut to 3-4 cm. The tenaculum was removed. Slight oozing from the tenaculum site was stopped with pressure.   The patient tolerated the procedure well.             Chaperone was present for exam.  ASSESSMENT Skyla IUD insertion No STD testing needed    PLAN F/U for an IUD check in one month Condoms if sexually active   An After Visit Summary was printed and given to the patient.

## 2015-07-05 NOTE — Patient Instructions (Signed)

## 2015-07-13 ENCOUNTER — Telehealth: Payer: Self-pay | Admitting: Nurse Practitioner

## 2015-07-13 NOTE — Telephone Encounter (Signed)
Patient has some questions about IUD.

## 2015-07-13 NOTE — Telephone Encounter (Signed)
I think the d/c is okay. I'm happy to see her if she is worried.

## 2015-07-13 NOTE — Telephone Encounter (Signed)
Spoke with patient. Advised of message as seen below from Dr.Jertson. She will monitor her symptoms and return call if symptoms persist or new symptoms develop.  Routing to provider for final review. Patient agreeable to disposition. Will close encounter.

## 2015-07-13 NOTE — Telephone Encounter (Signed)
Spoke with patient. Patient had a Skyla IUD placed on 07/05/2015 with Dr.Jertson. Reports she had two days of light spotting after. Then 4 days ago she began to have "orange" tinged that is odorless. Is having mild cramping. Denies any pain, fever, or chills. "I have never had a change in my mucous color before. I was just worried." Advised this change is coming from her recent IUD insertion and is not uncommon. Patient has never been sexually active. Advised I will speak with Dr.Jertson regarding symptoms and return call with any further recommendations. She is agreeable.

## 2015-07-15 ENCOUNTER — Telehealth: Payer: Self-pay | Admitting: Nurse Practitioner

## 2015-07-15 NOTE — Telephone Encounter (Signed)
I have discussed this with Dr. Edward JollySilva.  We want her to have a few more lab test done to check for anemia - Iron/ Ferritin levels.  Also will do the total Testosterone for women, and TSH levels.  She may come in for a consult visit and we can discuss.

## 2015-07-15 NOTE — Telephone Encounter (Signed)
Spoke with patient. Patient states that 3 days after her Christean GriefSkyla was placed she began to have increased hair loss. "When I run my fingers through my hair a lot of hair is falling out." Reports she has had similar symptoms due to hormone changes in the past. Patient is very concerned about amount of hair loss. Asking if this is common and if this will decreased the longer she has the IUD in place. Advised it can occur with hormones from the IUD, but the severity and length varies per patient. Patient would like me to speak with Ria CommentPatricia Grubb, FNP regarding symptoms and return call with further recommendations.

## 2015-07-15 NOTE — Telephone Encounter (Signed)
Patient's hair is falling out and she thinks it may be her iud.

## 2015-07-15 NOTE — Telephone Encounter (Signed)
Spoke with patient. Advised of message as seen below from Ria CommentPatricia Grubb, FNP. She is agreeable and verbalizes understanding. Appointment scheduled for 07/20/2015 at 10:15 am with Ria CommentPatricia Grubb, FNP. She is agreeable to date and time.  Routing to provider for final review. Patient agreeable to disposition. Will close encounter.

## 2015-07-20 ENCOUNTER — Ambulatory Visit (INDEPENDENT_AMBULATORY_CARE_PROVIDER_SITE_OTHER): Payer: 59 | Admitting: Nurse Practitioner

## 2015-07-20 ENCOUNTER — Encounter: Payer: Self-pay | Admitting: Nurse Practitioner

## 2015-07-20 ENCOUNTER — Other Ambulatory Visit: Payer: Self-pay | Admitting: Nurse Practitioner

## 2015-07-20 VITALS — BP 110/62 | HR 82 | Temp 98.1°F | Resp 16 | Wt 188.0 lb

## 2015-07-20 DIAGNOSIS — L659 Nonscarring hair loss, unspecified: Secondary | ICD-10-CM

## 2015-07-20 LAB — IRON,TIBC AND FERRITIN PANEL
%SAT: 37 % (ref 11–50)
IRON: 115 ug/dL (ref 40–190)
TIBC: 308 ug/dL (ref 250–450)

## 2015-07-20 LAB — IRON AND TIBC
%SAT: 37 % (ref 11–50)
IRON: 115 ug/dL (ref 40–190)
TIBC: 308 ug/dL (ref 250–450)
UIBC: 193 ug/dL (ref 125–400)

## 2015-07-20 LAB — CBC WITH DIFFERENTIAL/PLATELET
BASOS ABS: 0 {cells}/uL (ref 0–200)
Basophils Relative: 0 %
EOS PCT: 1 %
Eosinophils Absolute: 86 cells/uL (ref 15–500)
HCT: 40.3 % (ref 35.0–45.0)
HEMOGLOBIN: 13 g/dL (ref 11.7–15.5)
LYMPHS ABS: 1720 {cells}/uL (ref 850–3900)
Lymphocytes Relative: 20 %
MCH: 26.7 pg — AB (ref 27.0–33.0)
MCHC: 32.3 g/dL (ref 32.0–36.0)
MCV: 82.9 fL (ref 80.0–100.0)
MONO ABS: 602 {cells}/uL (ref 200–950)
MPV: 11.5 fL (ref 7.5–12.5)
Monocytes Relative: 7 %
NEUTROS ABS: 6192 {cells}/uL (ref 1500–7800)
Neutrophils Relative %: 72 %
Platelets: 258 10*3/uL (ref 140–400)
RBC: 4.86 MIL/uL (ref 3.80–5.10)
RDW: 14.5 % (ref 11.0–15.0)
WBC: 8.6 10*3/uL (ref 3.8–10.8)

## 2015-07-20 LAB — TSH: TSH: 0.72 m[IU]/L

## 2015-07-20 LAB — FERRITIN: FERRITIN: 12 ng/mL (ref 10–154)

## 2015-07-20 NOTE — Patient Instructions (Signed)
Alopecia Areata  Alopecia areata is a type of hair loss. If you have this condition, you may lose hair on your scalp in patches. In some cases, you may lose all the hair on your scalp (alopecia totalis) or all the hair from your face and body (alopecia universalis).   Alopecia areata is an autoimmune disease. This means your body's defense system (immune system) mistakes normal parts of your body for germs or other things that can make you sick. When you have alopecia areata, your immune system attacks your hair follicles.   Alopecia areata often starts during childhood but can occur at any age. Alopecia areata is not a danger to your health but can be stressful.   CAUSES   The cause of alopecia areata is unknown.   RISK FACTORS  You may be at higher risk of alopecia areata if you:   · Have a family history of alopecia.  · Have a family history of another autoimmune disease, including type 1 diabetes and rheumatoid arthritis.  SIGNS AND SYMPTOMS  Signs of alopecia areata may include:  · Loss of scalp hair in small, round patches. These may be about the size of a quarter.  · Loss of all hair on your scalp.  · Loss of eyebrow hair, facial hair, or the hair inside your nose (nasal hair).  · Hair loss over your entire body.  DIAGNOSIS   Alopecia areata may be diagnosed by:  · Medical history and physical exam.  · Taking a sample of hair to check under a microscope.  · Taking a small piece of skin (biopsy) to examine under a microscope.  · Blood tests to rule out other autoimmune diseases.  TREATMENT   There is no cure for alopecia areata, but the disease often goes away over time. You will not lose the ability to regrow hair. Some medicines may help your hair regrow more quickly. These include:  · Corticosteroids. These block inflammation caused by your immune system. You may get this medicine as a lotion for your skin or as an injection.  · Minoxidil. This is a hair growth medicine you can use in areas of hair  loss.  · Anthralin. This is a medicine for a skin inflammation called psoriasis that may also help alopecia.  · Diphencyprone. This medicine is applied to your skin and may stimulate hair growth.  HOME CARE INSTRUCTIONS  · Use sunscreen or cover your head when outdoors.  · Take medicines only as directed by your health care provider.  · If you have lost your eyebrows, wear sunglasses outside to keep dust out of your eyes.  · If you have lost hair inside your nose, wear a kerchief over your face or apply ointment to the inside of your nose. This keeps out dust and other irritants.  · Keep all follow-up visits as directed by your health care provider. This is important.  SEEK MEDICAL CARE IF:  · Your symptoms change.  · You have new symptoms.  · You have a reaction to your medicines.  · You are struggling emotionally.     This information is not intended to replace advice given to you by your health care provider. Make sure you discuss any questions you have with your health care provider.     Document Released: 09/03/2003 Document Revised: 02/19/2014 Document Reviewed: 04/20/2013  Elsevier Interactive Patient Education ©2016 Elsevier Inc.

## 2015-07-20 NOTE — Progress Notes (Signed)
25 y.o. Single Caucasian female G0P0000 here for consult about alopecia.  Patient had Skyla IUD insertion on 07/05/15.  Since then she has noted some' hormonal changes' with increase in acne, increase in vaginal discharge, and thinning of hair.  There were cramps the next day after procedure and since then getting cramps that are mild about mid day.  Not SA at this time.  She was concerned even though she wanted the IUD left in.  She did have her hair bleached about 3 months ago but no treatments since then.  Most of hair loss was in general with shampoo and drying, but was a significant amount.  Consult with Dr. Edward JollySilva and we decided to do some basic labs.     O: Healthy WD,WN female Affect: no distress Skin: some cystic acne on face   A: Hair loss  Skyla IUD insertion 07/05/15  Increase in acne - using topical treatment     P:  Discussed the hormonal changes and need to give her body time to adjust and she is willing to do that.  In the interim to let us know if symptoms worsen.   Labs: CBC, TIBC, ferritin, total testosterone, estradiol, TSH   Instructions given regarding: will notify her of labs  RV  Consult time:  15 mintues

## 2015-07-21 LAB — ESTRADIOL: ESTRADIOL: 314 pg/mL

## 2015-07-22 LAB — TESTOSTERONE, TOTAL, LC/MS/MS: TESTOSTERONE, TOTAL, LC-MS-MS: 27 ng/dL (ref 2–45)

## 2015-07-23 NOTE — Progress Notes (Signed)
Encounter reviewed April Maultsby, MD   

## 2015-08-02 ENCOUNTER — Encounter: Payer: Self-pay | Admitting: Obstetrics and Gynecology

## 2015-08-02 ENCOUNTER — Ambulatory Visit (INDEPENDENT_AMBULATORY_CARE_PROVIDER_SITE_OTHER): Payer: 59 | Admitting: Obstetrics and Gynecology

## 2015-08-02 VITALS — BP 110/70 | HR 64 | Resp 14 | Wt 187.0 lb

## 2015-08-02 DIAGNOSIS — Z30431 Encounter for routine checking of intrauterine contraceptive device: Secondary | ICD-10-CM

## 2015-08-02 NOTE — Progress Notes (Signed)
Patient ID: April Rivas, female   DOB: 1990-09-20, 25 y.o.   MRN: [id number] GYNECOLOGY  VISIT    HPI: 25 y.o.   Single  Caucasian  female   G0P0000 with Patient's last menstrual period was 07/22/2015.   here for IUD check, she had a skyla IUD placed last month. She was seen in the last few weeks by Ms Berneice Gandy for hair loss and acne. Lab work was normal, hair loss has stopped. Overall she loves the IUD, she has had one cycle so far it was lighter, but longer. Cramping is mild and improving. Her acne is tolerable.   GYNECOLOGIC HISTORY: Patient's last menstrual period was 07/22/2015. Contraception:IUD (SKYLA) Menopausal hormone therapy: none        OB History    Gravida Para Term Preterm AB TAB SAB Ectopic Multiple Living           Patient Active Problem List   Diagnosis Date Noted  . Depression with anxiety 03/01/2014  . ANKLE PAIN, LEFT 07/20/2009  . ROTATOR CUFF INJURY, LEFT SHOULDER 07/20/2009  . OTHER ACQUIRED DEFORMITY OF ANKLE AND FOOT OTHER 07/20/2009  . AMENORRHEA, PRIMARY 09/10/2008  . CERVICAL LYMPHADENOPATHY 09/10/2008    Past Medical History  Diagnosis Date  . IBS (irritable bowel syndrome)   . Anemia   . Depression   . Anxiety     Past Surgical History  Procedure Laterality Date  . Intrauterine device (iud) insertion  07/05/15    Skyla     Current Outpatient Prescriptions  Medication Sig Dispense Refill  . albuterol (PROVENTIL HFA;VENTOLIN HFA) 108 (90 BASE) MCG/ACT inhaler Inhale 2 puffs into the lungs every 4 (four) hours as needed for wheezing or shortness of breath. 1 Inhaler 5  . Levonorgestrel (SKYLA) 13.5 MG IUD by Intrauterine route. Inserted 07/05/15    . Multiple Vitamins-Minerals (MULTIVITAMIN PO) Take by mouth daily.    . Naproxen Sod-Diphenhydramine (ALEVE PM PO) Take by mouth.    Marland Kitchen FLUoxetine (PROZAC) 20 MG tablet Take 1 tablet (20 mg total) by mouth daily. (Patient not taking: Reported on 07/20/2015) 90 tablet 3   No  current facility-administered medications for this visit.     ALLERGIES: Benzoyl peroxide  Family History  Problem Relation Age of Onset  . Depression    . Melanoma      PGF  . Hypertension      Maternal Side   . Anemia Mother   . Hypertension Mother   . Diabetes Mellitus II Paternal Grandmother 41  . Breast cancer Paternal Grandmother 53    lumpectomy and 2 months of chemo  . Heart failure Maternal Grandfather   . Heart attack Maternal Grandfather   . Stroke Maternal Grandmother   . Diabetes Paternal Grandfather   . Stroke Maternal Aunt   . Heart attack Maternal Aunt     Social History   Social History  . Marital Status: Single    Spouse Name: N/A  . Number of Children: N/A  . Years of Education: N/A   Occupational History  . Not on file.   Social History Main Topics  . Smoking status: Never Smoker   . Smokeless tobacco: Never Used  . Alcohol Use: 0.0 oz/week    0 Standard drinks or equivalent per week     Comment: occ  . Drug Use: No  . Sexual Activity: No     Comment: Insertion 07/05/15   Other Topics Concern  .  Not on file   Social History Narrative    Review of Systems  Constitutional: Negative.   HENT: Negative.   Eyes: Negative.   Respiratory: Negative.   Cardiovascular: Negative.   Gastrointestinal: Negative.   Genitourinary:       Long menstrual cycles  Musculoskeletal: Negative.   Skin: Negative.   Neurological: Negative.   Endo/Heme/Allergies: Negative.   Psychiatric/Behavioral: Negative.     PHYSICAL EXAMINATION:    BP 110/70 mmHg  Pulse 64  Resp 14  Wt 187 lb (84.823 kg)  LMP 07/22/2015    General appearance: alert, cooperative and appears stated age Skin: mild facial acne  Pelvic: External genitalia:  no lesions              Urethra:  normal appearing urethra with no masses, tenderness or lesions              Bartholins and Skenes: normal                 Vagina: normal appearing vagina with normal color and discharge, no  lesions              Cervix: no lesions and IUD string 3 cm              Bimanual Exam:  Uterus:  normal size, contour, position, consistency, mobility, non-tender              Adnexa: no mass, fullness, tenderness               Chaperone was present for exam.  ASSESSMENT IUD check, doing well    PLAN Routine f/u   An After Visit Summary was printed and given to the patient.

## 2015-09-14 ENCOUNTER — Other Ambulatory Visit: Payer: Self-pay | Admitting: Family Medicine

## 2015-09-14 ENCOUNTER — Encounter: Payer: Self-pay | Admitting: Family Medicine

## 2015-09-14 ENCOUNTER — Ambulatory Visit (INDEPENDENT_AMBULATORY_CARE_PROVIDER_SITE_OTHER): Payer: 59 | Admitting: Family Medicine

## 2015-09-14 VITALS — BP 127/83 | HR 81 | Temp 98.6°F | Ht 67.25 in | Wt 192.0 lb

## 2015-09-14 DIAGNOSIS — L309 Dermatitis, unspecified: Secondary | ICD-10-CM | POA: Diagnosis not present

## 2015-09-14 MED ORDER — TRIAMCINOLONE ACETONIDE 0.1 % EX CREA: 1.0000 "application " | TOPICAL_CREAM | Freq: Two times a day (BID) | CUTANEOUS | 2 refills | Status: DC

## 2015-09-14 NOTE — Progress Notes (Signed)
   Subjective:    Patient ID: April Rivas, female    DOB: 1990/08/20, 25 y.o.   MRN: [id number]  HPI Here for an itchy rash that started in both elbows and has now spread to the neck. She has tried using Neosporin with no effect.    Review of Systems  Constitutional: Negative.   Skin: Positive for rash.       Objective:   Physical Exam  Constitutional: She appears well-developed and well-nourished.  Skin:  She has patches of macular red scaly skin in both antecubital spaces and on the anterior neck           Assessment & Plan:  Eczema, try Triamcinolone cream prn.  Nelwyn Salisbury, MD

## 2015-09-14 NOTE — Progress Notes (Signed)
Pre visit review using our clinic review tool, if applicable. No additional management support is needed unless otherwise documented below in the visit note. 

## 2015-09-14 NOTE — Telephone Encounter (Signed)
Pt following up on request refill  temazepam (RESTORIL) 30 MG capsule   Pt saw Dr Clent Ridges today and thought he was going to refill but not at the pharmacy.  [facility]/ [address]

## 2015-09-14 NOTE — Telephone Encounter (Signed)
Call in Temazepam 30 mg qhs #30 with 2 rf 

## 2015-10-25 ENCOUNTER — Encounter: Payer: Self-pay | Admitting: Family Medicine

## 2015-10-25 ENCOUNTER — Ambulatory Visit (INDEPENDENT_AMBULATORY_CARE_PROVIDER_SITE_OTHER): Payer: 59 | Admitting: Family Medicine

## 2015-10-25 ENCOUNTER — Telehealth: Payer: Self-pay | Admitting: Family Medicine

## 2015-10-25 VITALS — BP 110/82 | HR 94 | Temp 98.4°F | Ht 67.25 in | Wt 194.6 lb

## 2015-10-25 DIAGNOSIS — S8012XA Contusion of left lower leg, initial encounter: Secondary | ICD-10-CM | POA: Diagnosis not present

## 2015-10-25 NOTE — Telephone Encounter (Signed)
Patient Name: April Rivas  DOB: 02/19/1990    Initial Comment Caller states she has been having periods of leg pain, has questions about DVT. She also has a tender bump on her leg.    Nurse Assessment  Nurse: Sherilyn CooterHenry, RN, Thurmond ButtsWade Date/Time April Rivas(Eastern Time): 10/25/2015 11:16:37 AM  Confirm and document reason for call. If symptomatic, describe symptoms. You must click the next button to save text entered. ---Caller states that she has been having leg pain near her left knee. The pain began in the upper calf area. She has not had any activity out of the ordinary. She denies swelling and redness. She denies pain at present. There is a raised tender area near the upper thigh area. The bump is near the surface. The bump is about the size of a half dollar. She denies chest pain and difficulty breathing.  Has the patient traveled out of the country within the last 30 days? ---No  Does the patient have any new or worsening symptoms? ---Yes  Will a triage be completed? ---Yes  Related visit to physician within the last 2 weeks? ---No  Does the PT have any chronic conditions? (i.e. diabetes, asthma, etc.) ---Yes  List chronic conditions. ---Anxiety,  Is the patient pregnant or possibly pregnant? (Ask all females between the ages of 7612-55) ---No  Is this a behavioral health or substance abuse call? ---No     Guidelines    Guideline Title Affirmed Question Affirmed Notes  Skin Lump or Localized Swelling [1] Swelling is painful to touch AND [2] no fever    Final Disposition User   See Physician within 24 Hours Sherilyn CooterHenry, RN, Thurmond ButtsWade    Comments  Appointment scheduled for today at 2:00pm with Dr. Gershon CraneStephen Fry.   Referrals  REFERRED TO PCP OFFICE   Disagree/Comply: Comply

## 2015-10-25 NOTE — Telephone Encounter (Signed)
Noted  

## 2015-10-25 NOTE — Progress Notes (Signed)
Pre visit review using our clinic review tool, if applicable. No additional management support is needed unless otherwise documented below in the visit note. 

## 2015-10-25 NOTE — Progress Notes (Signed)
   Subjective:    Patient ID: April Rivas, female    DOB: 04/06/1990, 25 y.o.   MRN: [id number]  HPI Here to check a bruise on the left thigh that appeared yesterday. No trauma that she can remember. She has been studying for long periods of time for exams at school. No swelling in the legs or feet. No other bruising or bleeding issues.    Review of Systems  Constitutional: Negative.   Respiratory: Negative.   Cardiovascular: Negative.   Hematological: Does not bruise/bleed easily.       Objective:   Physical Exam  Constitutional: She appears well-developed and well-nourished. No distress.  Cardiovascular: Normal rate, regular rhythm, normal heart sounds and intact distal pulses.   Pulmonary/Chest: Effort normal and breath sounds normal.  Musculoskeletal: She exhibits no edema.  The upper lateral left thigh has a small hematoma with a small ecchymosis over it          Assessment & Plan:  Small bruise on the thigh of uncertain etiology. Apparently she struck it or had something laying on it (a book bag perhaps) for an extended period of time. Reassured that this seems to be benign. Recheck prn.  Nelwyn SalisburyFRY,STEPHEN A, MD

## 2015-12-31 ENCOUNTER — Encounter (HOSPITAL_COMMUNITY): Payer: Self-pay

## 2015-12-31 ENCOUNTER — Inpatient Hospital Stay (HOSPITAL_COMMUNITY)
Admission: AD | Admit: 2015-12-31 | Discharge: 2015-12-31 | Disposition: A | Discharge status: Home / Self Care | Payer: 59 | Source: Ambulatory Visit | Attending: Gynecology | Admitting: Gynecology

## 2015-12-31 DIAGNOSIS — F411 Generalized anxiety disorder: Secondary | ICD-10-CM

## 2015-12-31 DIAGNOSIS — M79651 Pain in right thigh: Secondary | ICD-10-CM | POA: Insufficient documentation

## 2015-12-31 DIAGNOSIS — M79604 Pain in right leg: Secondary | ICD-10-CM

## 2015-12-31 LAB — URINALYSIS, ROUTINE W REFLEX MICROSCOPIC
Bilirubin Urine: NEGATIVE
GLUCOSE, UA: NEGATIVE mg/dL
Ketones, ur: NEGATIVE mg/dL
LEUKOCYTES UA: NEGATIVE
Nitrite: NEGATIVE
PROTEIN: NEGATIVE mg/dL
SPECIFIC GRAVITY, URINE: 1.015 (ref 1.005–1.030)
pH: 6.5 (ref 5.0–8.0)

## 2015-12-31 LAB — URINE MICROSCOPIC-ADD ON: WBC, UA: NONE SEEN WBC/hpf (ref 0–5)

## 2015-12-31 NOTE — Discharge Instructions (Signed)
Generalized Anxiety Disorder Generalized anxiety disorder (GAD) is a mental disorder. It interferes with life functions, including relationships, work, and school. GAD is different from normal anxiety, which everyone experiences at some point in their lives in response to specific life events and activities. Normal anxiety actually helps us prepare for and get through these life events and activities. Normal anxiety goes away after the event or activity is over.  GAD causes anxiety that is not necessarily related to specific events or activities. It also causes excess anxiety in proportion to specific events or activities. The anxiety associated with GAD is also difficult to control. GAD can vary from mild to severe. People with severe GAD can have intense waves of anxiety with physical symptoms (panic attacks).  SYMPTOMS The anxiety and worry associated with GAD are difficult to control. This anxiety and worry are related to many life events and activities and also occur more days than not for 6 months or longer. People with GAD also have three or more of the following symptoms (one or more in children):  Restlessness.   Fatigue.  Difficulty concentrating.   Irritability.  Muscle tension.  Difficulty sleeping or unsatisfying sleep. DIAGNOSIS GAD is diagnosed through an assessment by your health care provider. Your health care provider will ask you questions aboutyour mood,physical symptoms, and events in your life. Your health care provider may ask you about your medical history and use of alcohol or drugs, including prescription medicines. Your health care provider may also do a physical exam and blood tests. Certain medical conditions and the use of certain substances can cause symptoms similar to those associated with GAD. Your health care provider may refer you to a mental health specialist for further evaluation. TREATMENT The following therapies are usually used to treat GAD:    Medication. Antidepressant medication usually is prescribed for long-term daily control. Antianxiety medicines may be added in severe cases, especially when panic attacks occur.   Talk therapy (psychotherapy). Certain types of talk therapy can be helpful in treating GAD by providing support, education, and guidance. A form of talk therapy called cognitive behavioral therapy can teach you healthy ways to think about and react to daily life events and activities.  Stress managementtechniques. These include yoga, meditation, and exercise and can be very helpful when they are practiced regularly. A mental health specialist can help determine which treatment is best for you. Some people see improvement with one therapy. However, other people require a combination of therapies. This information is not intended to replace advice given to you by your health care provider. Make sure you discuss any questions you have with your health care provider. Document Released: 05/26/2012 Document Revised: 02/19/2014 Document Reviewed: 05/26/2012 Elsevier Interactive Patient Education  2017 Elsevier Inc.  

## 2015-12-31 NOTE — MAU Note (Signed)
Pt c/o right leg pain that started Wednesday-went to Urgent Care and has doppler studies scheduled for Monday. States that about 2 hours ago she had a pinpoint pain initially that spread through mid thigh and side of leg-different from pain Wednesday. States that she went back to Urgent Care and they told her to come here. Pain comes and goes, none now. LMP: 12/09/2016. Pt has Skyla IUD placed by Dr Oscar LaJertson.

## 2015-12-31 NOTE — MAU Provider Note (Signed)
History   25 yo WF in with c/o intermittent pain in upper right thigh since Wednesday. Saw primary care and has us ordered for Monday.   CSN: [id number]  Arrival date & time 12/31/15  1451   None     Chief Complaint  Patient presents with  . Leg Pain    HPI  Past Medical History:  Diagnosis Date  . Anemia   . Anxiety   . Depression   . IBS (irritable bowel syndrome)     Past Surgical History:  Procedure Laterality Date  . INTRAUTERINE DEVICE (IUD) INSERTION  07/05/15   Skyla     Family History  Problem Relation Age of Onset  . Anemia Mother   . Hypertension Mother   . Diabetes Mellitus II Paternal Grandmother [phone]  . Breast cancer Paternal Grandmother [phone]    lumpectomy and 2 months of chemo  . Heart failure Maternal Grandfather   . Heart attack Maternal Grandfather   . Stroke Maternal Grandmother   . Diabetes Paternal Grandfather   . Depression    . Melanoma      PGF  . Hypertension      Maternal Side   . Stroke Maternal Aunt   . Heart attack Maternal Aunt     Social History  Substance Use Topics  . Smoking status: Never Smoker  . Smokeless tobacco: Never Used  . Alcohol use 0.0 oz/week     Comment: occ    OB History    Gravida Para Term Preterm AB Living   0 0 0 0 0 0   SAB TAB Ectopic Multiple Live Births   0 0 0 0        Review of Systems  Constitutional: Negative.   HENT: Negative.   Eyes: Negative.   Respiratory: Negative.   Cardiovascular: Negative.   Gastrointestinal: Negative.   Endocrine: Negative.   Musculoskeletal:       Right leg pain  Skin: Negative.   Allergic/Immunologic: Negative.   Neurological: Negative.   Hematological: Negative.   Psychiatric/Behavioral: Negative.     Allergies  Benzoyl peroxide  Home Medications    BP 132/87   Pulse 106   Temp 98.7 F (37.1 C) (Oral)   Resp 18   Ht 5\' 8"  (1.727 m)   Wt 197 lb (89.4 kg)   LMP 12/10/2015   SpO2 99%   BMI 29.95 kg/m   Physical Exam  Constitutional: She  is oriented to person, place, and time. She appears well-developed and well-nourished.  HENT:  Head: Normocephalic.  Eyes: Pupils are equal, round, and reactive to light.  Neck: Normal range of motion. Neck supple.  Cardiovascular: Normal rate, regular rhythm, normal heart sounds and intact distal pulses.   Pulmonary/Chest: Effort normal and breath sounds normal.  Abdominal: Soft. Bowel sounds are normal.  Musculoskeletal: Normal range of motion.  Neurological: She is alert and oriented to person, place, and time. She has normal reflexes.  Skin: Skin is warm and dry.  Psychiatric: She has a normal mood and affect. Her behavior is normal. Judgment and thought content normal.    MAU Course  Procedures (including critical care time)  Labs Reviewed  URINALYSIS, ROUTINE W REFLEX MICROSCOPIC (NOT AT [facility]) - Abnormal; Notable for the following:       Result Value   Hgb urine dipstick TRACE (*)    All other components within normal limits  URINE MICROSCOPIC-ADD ON - Abnormal; Notable for the following:    Squamous Epithelial /  LPF 0-5 (*)    Bacteria, UA RARE (*)    All other components within normal limits   No results found.   No diagnosis found.    MDM  VSS, neg homan's sign, no swelling, redness, or tenderness with palpation and exam. Both legs symmetric.Lenghty discussion with pt and she really feels it is her anxiety that is the problem. Pt declines us of r leg after discussion. Desires d/c home

## 2016-03-05 ENCOUNTER — Ambulatory Visit (INDEPENDENT_AMBULATORY_CARE_PROVIDER_SITE_OTHER): Payer: 59 | Admitting: Family Medicine

## 2016-03-05 ENCOUNTER — Encounter: Payer: Self-pay | Admitting: Family Medicine

## 2016-03-05 VITALS — BP 126/98 | HR 101 | Temp 98.6°F | Ht 68.0 in | Wt 201.0 lb

## 2016-03-05 DIAGNOSIS — M79604 Pain in right leg: Secondary | ICD-10-CM | POA: Diagnosis not present

## 2016-03-05 NOTE — Progress Notes (Signed)
   Subjective:    Patient ID: April Rivas, female    DOB: 08/03/1990, 26 y.o.   MRN: [id number]  HPI Here for 3 months of intermittent pains or numbness in the right leg. This started fairly suddenly, and she had some lower back pain with it at first. Then after a week the back pain subsided. No weakness in the leg. No hx of trauma. She as been seeing a Insurance account manage[facility]urologist [facility] and they suggested she take Gabapentin and Meloxicam for this, but she has not started these as yet. No studies or imaging has been done to date. She asks to get a second opinion.    Review of Systems  Constitutional: Negative.   Respiratory: Negative.   Cardiovascular: Negative.   Musculoskeletal: Positive for myalgias. Negative for arthralgias and back pain.  Neurological: Positive for numbness. Negative for dizziness, tremors, seizures, syncope, speech difficulty, weakness, light-headedness and headaches.       Objective:   Physical Exam  Constitutional: She is oriented to person, place, and time. She appears well-developed and well-nourished.  Cardiovascular: Normal rate, regular rhythm, normal heart sounds and intact distal pulses.   Pulmonary/Chest: Effort normal and breath sounds normal.  Musculoskeletal: Normal range of motion. She exhibits no edema, tenderness or deformity.  The leg appears normal, no swelling  Neurological: She is alert and oriented to person, place, and time. She exhibits normal muscle tone.          Assessment & Plan:  Right leg pain and numbness, it is not clear if this is a sciatica type issue or an intrinsic neuropathy. We will refer to the [facility] to assess further. Gershon CraneStephen Jaskiran Pata, MD

## 2016-03-05 NOTE — Progress Notes (Signed)
Pre visit review using our clinic review tool, if applicable. No additional management support is needed unless otherwise documented below in the visit note. 

## 2016-05-02 ENCOUNTER — Ambulatory Visit (INDEPENDENT_AMBULATORY_CARE_PROVIDER_SITE_OTHER): Payer: 59 | Admitting: Family Medicine

## 2016-05-02 ENCOUNTER — Encounter: Payer: Self-pay | Admitting: Family Medicine

## 2016-05-02 VITALS — BP 136/98 | HR 96 | Temp 98.7°F | Ht 68.0 in | Wt 200.0 lb

## 2016-05-02 DIAGNOSIS — J039 Acute tonsillitis, unspecified: Secondary | ICD-10-CM

## 2016-05-02 MED ORDER — CEPHALEXIN 500 MG PO CAPS: 500.0000 mg | ORAL_CAPSULE | Freq: Four times a day (QID) | ORAL | 0 refills | Status: AC

## 2016-05-02 NOTE — Progress Notes (Signed)
Pre visit review using our clinic review tool, if applicable. No additional management support is needed unless otherwise documented below in the visit note. 

## 2016-05-02 NOTE — Progress Notes (Signed)
   Subjective:    Patient ID: April Rivas, female April Rivas   DOB: 04/05/1990, 26 y.o.   MRN: [id number]  HPI Here for 2 weeks of URI symptoms. She had fevers up to 103 degrees at first, but this went away. She has had mild headaches and a dry cough. Now she has developed a ST which is worse on the right side. The nodes on the right neck are swollen. Using Ibuprofen and Dayquil.    Review of Systems  Constitutional: Negative.   HENT: Positive for postnasal drip and sore throat. Negative for congestion, ear pain, sinus pain and sinus pressure.   Eyes: Negative.   Respiratory: Positive for cough.        Objective:   Physical Exam  Constitutional: She appears well-developed and well-nourished.  HENT:  Right Ear: External ear normal.  Left Ear: External ear normal.  Nose: Nose normal.  The OP has no erythema but the right tonsil is enlarged   Eyes: Conjunctivae are normal.  Neck: No thyromegaly present.  Single enlarged tender right AC node   Pulmonary/Chest: Effort normal and breath sounds normal.          Assessment & Plan:  Tonsillitis, treat with Keflex.  Gershon CraneStephen Oni Dietzman, MD

## 2016-05-02 NOTE — Patient Instructions (Signed)
WE NOW OFFER   [facility] FAST TRACK!!!  SAME DAY Appointments for ACUTE CARE  Such as: Sprains, Injuries, cuts, abrasions, rashes, muscle pain, joint pain, back pain Colds, flu, sore throats, headache, allergies, cough, fever  Ear pain, sinus and eye infections Abdominal pain, nausea, vomiting, diarrhea, upset stomach Animal/insect bites  3 Easy Ways to Schedule: Walk-In Scheduling Call in scheduling Mychart Sign-up: [email or web address]         

## 2016-06-11 ENCOUNTER — Encounter: Payer: Self-pay | Admitting: Family Medicine

## 2016-06-11 ENCOUNTER — Ambulatory Visit (INDEPENDENT_AMBULATORY_CARE_PROVIDER_SITE_OTHER): Payer: 59 | Admitting: Family Medicine

## 2016-06-11 VITALS — BP 130/98 | HR 102 | Temp 98.8°F | Ht 68.0 in | Wt 197.0 lb

## 2016-06-11 DIAGNOSIS — J0391 Acute recurrent tonsillitis, unspecified: Secondary | ICD-10-CM | POA: Diagnosis not present

## 2016-06-11 MED ORDER — AMOXICILLIN-POT CLAVULANATE 875-125 MG PO TABS: 1.0000 | ORAL_TABLET | Freq: Two times a day (BID) | ORAL | 0 refills | Status: DC

## 2016-06-11 NOTE — Progress Notes (Signed)
   Subjective:    Patient ID: April Rivas, female    DOB: [birth year], 24 y.o.   MRN: [id number]  HPI Here with another flare of right tonsil pain and fever. We saw her on 05-12-16 for fever, pain in the right throat area and swollen right lymph nodes. We treated her with Kelfex and it all went away. Now the same thing started about a week ago. She had fevers to 102 degrees for a few days, though not today. Her right throat are is swollen and tender.    Review of Systems  Constitutional: Positive for fever.  HENT: Positive for sore throat. Negative for postnasal drip, sinus pain, sinus pressure, trouble swallowing and voice change.   Eyes: Negative.   Respiratory: Negative.        Objective:   Physical Exam  Constitutional: She appears well-developed and well-nourished. No distress.  HENT:  Right Ear: External ear normal.  Left Ear: External ear normal.  Nose: Nose normal.  Right tonsil is red, swollen, and has white exudate on it   Neck: Neck supple. No thyromegaly present.  Single tender enlarged right AC node   Pulmonary/Chest: Effort normal and breath sounds normal. No respiratory distress. She has no wheezes. She has no rales.          Assessment & Plan:  Tonsillitis, recurrent. We will treat with Augmentin for 10 days. If she would have a third flare sometime soon we will consider a referral to ENT for possible tonsillectomy  Gershon Crane, MD

## 2016-06-11 NOTE — Progress Notes (Signed)
Pre visit review using our clinic review tool, if applicable. No additional management support is needed unless otherwise documented below in the visit note. 

## 2016-06-11 NOTE — Patient Instructions (Signed)
WE NOW OFFER   [facility] FAST TRACK!!!  SAME DAY Appointments for ACUTE CARE  Such as: Sprains, Injuries, cuts, abrasions, rashes, muscle pain, joint pain, back pain Colds, flu, sore throats, headache, allergies, cough, fever  Ear pain, sinus and eye infections Abdominal pain, nausea, vomiting, diarrhea, upset stomach Animal/insect bites  3 Easy Ways to Schedule: Walk-In Scheduling Call in scheduling Mychart Sign-up: [email or web address]         

## 2016-06-29 ENCOUNTER — Encounter: Payer: Self-pay | Admitting: Nurse Practitioner

## 2016-06-29 ENCOUNTER — Ambulatory Visit (INDEPENDENT_AMBULATORY_CARE_PROVIDER_SITE_OTHER): Payer: 59 | Admitting: Nurse Practitioner

## 2016-06-29 VITALS — BP 100/60 | HR 88 | Ht 67.75 in | Wt 199.0 lb

## 2016-06-29 DIAGNOSIS — Z975 Presence of (intrauterine) contraceptive device: Secondary | ICD-10-CM | POA: Diagnosis not present

## 2016-06-29 DIAGNOSIS — Z0000 Encounter for general adult medical examination without abnormal findings: Secondary | ICD-10-CM | POA: Diagnosis not present

## 2016-06-29 DIAGNOSIS — Z01419 Encounter for gynecological examination (general) (routine) without abnormal findings: Secondary | ICD-10-CM

## 2016-06-29 NOTE — Progress Notes (Signed)
Patient ID: April Rivas, female   DOB: 04/22/1990, 26 y.o.   MRN: [id number]  26 y.o. G0P0000 Single  Caucasian Fe here for annual exam.  After IUD insertion; for 3 months cycle was normal but lighter. Then for several months 2 days of menses and light spotting.  Past 3 months more light bleeding at 5 days.  PMS is so much better, cramps are very much improved!!  Enjoys having IUD. Sh is dating but not SA with vaginal penetration.    Right leg pain - seen in ED and PCP last fall and diagnosed as sciatica. Now less pain.  Patient's last menstrual period was 06/11/2016 (exact date).          Sexually active: No. Never SA The current method of family planning is IUD and abstinence. Skyla 07/05/15, same boyfriend x 1.5 yrs, but has not had vaginal penetration, boyfriend [place] in [place] in [place]   Exercising: Yes.    spinning, running, weights, tennis Smoker:  no  Health Maintenance: Pap:  03/30/14, Negative History of Abnormal Pap: no TDaP:  2012 HIV: Never, not SA Gardasil: Never, would like to start series Labs: none   reports that she has never smoked. She has never used smokeless tobacco. She reports that she drinks about 0.6 - 2.4 oz of alcohol per week . She reports that she does not use drugs.  Past Medical History:  Diagnosis Date  . Anemia   . Anxiety   . Depression   . IBS (irritable bowel syndrome)     Past Surgical History:  Procedure Laterality Date  . INTRAUTERINE DEVICE (IUD) INSERTION  07/05/15   Skyla     Current Outpatient Prescriptions  Medication Sig Dispense Refill  . FLUoxetine (PROZAC) 20 MG tablet Take 1 tablet (20 mg total) by mouth daily. 90 tablet 3  . Levonorgestrel (SKYLA) 13.5 MG IUD by Intrauterine route. Inserted 07/05/15    . Multiple Vitamins-Minerals (MULTIVITAMIN PO) Take by mouth daily.    . pregabalin (LYRICA) 150 MG capsule Take 150 mg by mouth daily as needed (pain).     No current facility-administered medications for this visit.      Family History  Problem Relation Age of Onset  . Anemia Mother   . Hypertension Mother   . Diabetes Mellitus II Paternal Grandmother [phone]  . Breast cancer Paternal Grandmother [phone]       lumpectomy and 2 months of chemo  . Heart failure Maternal Grandfather   . Heart attack Maternal Grandfather   . Stroke Maternal Grandmother   . Diabetes Paternal Grandfather   . Depression Unknown   . Melanoma Unknown        PGF  . Hypertension Unknown        Maternal Side   . Stroke Maternal Aunt   . Heart attack Maternal Aunt     ROS:  Pertinent items are noted in HPI.  Otherwise, a comprehensive ROS was negative.  Exam:   BP 100/60 (BP Location: Right Arm, Patient Position: Sitting, Cuff Size: Large)   Pulse 88   Ht 5' 7.75" (1.721 m)   Wt 199 lb (90.3 kg)   LMP 06/11/2016 (Exact Date)   BMI 30.48 kg/m  Height: 5' 7.75" (172.1 cm) Ht Readings from Last 3 Encounters:  06/29/16 5' 7.75" (1.721 m)  06/11/16 5\' 8"  (1.727 m)  05/02/16 5\' 8"  (1.727 m)    General appearance: alert, cooperative and appears stated age Head: Normocephalic, without obvious abnormality, atraumatic Neck: no adenopathy,  supple, symmetrical, trachea midline and thyroid normal to inspection and palpation Lungs: clear to auscultation bilaterally Breasts: normal appearance, no masses or tenderness Heart: regular rate and rhythm Abdomen: soft, non-tender; no masses,  no organomegaly Extremities: extremities normal, atraumatic, no cyanosis or edema Skin: Skin color, texture, turgor normal. No rashes or lesions Lymph nodes: Cervical, supraclavicular, and axillary nodes normal. No abnormal inguinal nodes palpated Neurologic: Grossly normal   Pelvic: External genitalia:  no lesions              Urethra:  normal appearing urethra with no masses, tenderness or lesions              Bartholin's and Skene's: normal                 Vagina: normal appearing vagina with normal color and discharge, no lesions               Cervix: anteverted IUD strings are visible              Pap taken: No. Bimanual Exam:  Uterus:  normal size, contour, position, consistency, mobility, non-tender              Adnexa: no mass, fullness, tenderness               Rectovaginal: Confirms               Anus:  normal sphincter tone, no lesions  Chaperone present: yes A:  Well Woman with normal exam   never SA History of FCB History of acne, depression and anxiety             Elevated BP on OCP, Nuva Ring  S/P Skyla IUD 07/05/15   P:   Reviewed health and wellness pertinent to exam  Pap smear: no  Continue to monitor menses  Counseled on breast self exam, adequate intake of calcium and vitamin D, diet and exercise return annually or prn  An After Visit Summary was printed and given to the patient.

## 2016-06-29 NOTE — Progress Notes (Signed)
Encounter reviewed by Dr. Zniya Cottone Amundson C. Silva.  

## 2016-06-29 NOTE — Patient Instructions (Signed)
General topics  Next pap or exam is  due in 1 year Take a Women's multivitamin Take 1200 mg. of calcium daily - prefer dietary If any concerns in interim to call back  Breast Self-Awareness Practicing breast self-awareness may pick up problems early, prevent significant medical complications, and possibly save your life. By practicing breast self-awareness, you can become familiar with how your breasts look and feel and if your breasts are changing. This allows you to notice changes early. It can also offer you some reassurance that your breast health is good. One way to learn what is normal for your breasts and whether your breasts are changing is to do a breast self-exam. If you find a lump or something that was not present in the past, it is best to contact your caregiver right away. Other findings that should be evaluated by your caregiver include nipple discharge, especially if it is bloody; skin changes or reddening; areas where the skin seems to be pulled in (retracted); or new lumps and bumps. Breast pain is seldom associated with cancer (malignancy), but should also be evaluated by a caregiver. BREAST SELF-EXAM The best time to examine your breasts is 5 7 days after your menstrual period is over.  ExitCare Patient Information 2013 [employer].   Exercise to Stay Healthy Exercise helps you become and stay healthy. EXERCISE IDEAS AND TIPS Choose exercises that:  You enjoy.  Fit into your day. You do not need to exercise really hard to be healthy. You can do exercises at a slow or medium level and stay healthy. You can:  Stretch before and after working out.  Try yoga, Pilates, or tai chi.  Lift weights.  Walk fast, swim, jog, run, climb stairs, bicycle, dance, or rollerskate.  Take aerobic classes. Exercises that burn about 150 calories:  Running 1  miles in 15 minutes.  Playing volleyball for 45 to 60 minutes.  Washing and waxing a car for 45 to 60  minutes.  Playing touch football for 45 minutes.  Walking 1  miles in 35 minutes.  Pushing a stroller 1  miles in 30 minutes.  Playing basketball for 30 minutes.  Raking leaves for 30 minutes.  Bicycling 5 miles in 30 minutes.  Walking 2 miles in 30 minutes.  Dancing for 30 minutes.  Shoveling snow for 15 minutes.  Swimming laps for 20 minutes.  Walking up stairs for 15 minutes.  Bicycling 4 miles in 15 minutes.  Gardening for 30 to 45 minutes.  Jumping rope for 15 minutes.  Washing windows or floors for 45 to 60 minutes. Document Released: 03/03/2010 Document Revised: 04/23/2011 Document Reviewed: 03/03/2010 ExitCare Patient Information 2013 [employer].   Other topics ( that may be useful information):    Sexually Transmitted Disease Sexually transmitted disease (STD) refers to any infection that is passed from person to person during sexual activity. This may happen by way of saliva, semen, blood, vaginal mucus, or urine. Common STDs include:  Gonorrhea.  Chlamydia.  Syphilis.  HIV/AIDS.  Genital herpes.  Hepatitis B and C.  Trichomonas.  Human papillomavirus (HPV).  Pubic lice. CAUSES  An STD may be spread by bacteria, virus, or parasite. A person can get an STD by:  Sexual intercourse with an infected person.  Sharing sex toys with an infected person.  Sharing needles with an infected person.  Having intimate contact with the genitals, mouth, or rectal areas of an infected person. SYMPTOMS  Some people may not have any symptoms, but   they can still pass the infection to others. Different STDs have different symptoms. Symptoms include:  Painful or bloody urination.  Pain in the pelvis, abdomen, vagina, anus, throat, or eyes.  Skin rash, itching, irritation, growths, or sores (lesions). These usually occur in the genital or anal area.  Abnormal vaginal discharge.  Penile discharge in men.  Soft, flesh-colored skin growths in the  genital or anal area.  Fever.  Pain or bleeding during sexual intercourse.  Swollen glands in the groin area.  Yellow skin and eyes (jaundice). This is seen with hepatitis. DIAGNOSIS  To make a diagnosis, your caregiver may:  Take a medical history.  Perform a physical exam.  Take a specimen (culture) to be examined.  Examine a sample of discharge under a microscope.  Perform blood test TREATMENT   Chlamydia, gonorrhea, trichomonas, and syphilis can be cured with antibiotic medicine.  Genital herpes, hepatitis, and HIV can be treated, but not cured, with prescribed medicines. The medicines will lessen the symptoms.  Genital warts from HPV can be treated with medicine or by freezing, burning (electrocautery), or surgery. Warts may come back.  HPV is a virus and cannot be cured with medicine or surgery.However, abnormal areas may be followed very closely by your caregiver and may be removed from the cervix, vagina, or vulva through office procedures or surgery. If your diagnosis is confirmed, your recent sexual partners need treatment. This is true even if they are symptom-free or have a negative culture or evaluation. They should not have sex until their caregiver says it is okay. HOME CARE INSTRUCTIONS  All sexual partners should be informed, tested, and treated for all STDs.  Take your antibiotics as directed. Finish them even if you start to feel better.  Only take over-the-counter or prescription medicines for pain, discomfort, or fever as directed by your caregiver.  Rest.  Eat a balanced diet and drink enough fluids to keep your urine clear or pale yellow.  Do not have sex until treatment is completed and you have followed up with your caregiver. STDs should be checked after treatment.  Keep all follow-up appointments, Pap tests, and blood tests as directed by your caregiver.  Only use latex condoms and water-soluble lubricants during sexual activity. Do not use  petroleum jelly or oils.  Avoid alcohol and illegal drugs.  Get vaccinated for HPV and hepatitis. If you have not received these vaccines in the past, talk to your caregiver about whether one or both might be right for you.  Avoid risky sex practices that can break the skin. The only way to avoid getting an STD is to avoid all sexual activity.Latex condoms and dental dams (for oral sex) will help lessen the risk of getting an STD, but will not completely eliminate the risk. SEEK MEDICAL CARE IF:   You have a fever.  You have any new or worsening symptoms. Document Released: 04/21/2002 Document Revised: 04/23/2011 Document Reviewed: 04/28/2010 [facility] Patient Information 2013 [employer].    Domestic Abuse You are being battered or abused if someone close to you hits, pushes, or physically hurts you in any way. You also are being abused if you are forced into activities. You are being sexually abused if you are forced to have sexual contact of any kind. You are being emotionally abused if you are made to feel worthless or if you are constantly threatened. It is important to remember that help is available. No one has the right to abuse you. PREVENTION OF FURTHER  ABUSE  Learn the warning signs of danger. This varies with situations but may include: the use of alcohol, threats, isolation from friends and family, or forced sexual contact. Leave if you feel that violence is going to occur.  If you are attacked or beaten, report it to the police so the abuse is documented. You do not have to press charges. The police can protect you while you or the attackers are leaving. Get the officer's name and badge number and a copy of the report.  Find someone you can trust and tell them what is happening to you: your caregiver, a nurse, clergy member, close friend or family member. Feeling ashamed is natural, but remember that you have done nothing wrong. No one deserves abuse. Document Released:  01/27/2000 Document Revised: 04/23/2011 Document Reviewed: 04/06/2010 ExitCare Patient Information 2013 [employer]Care[employer].    How Much is Too Much Alcohol? Drinking too much alcohol can cause injury, accidents, and health problems. These types of problems can include:   Car crashes.  Falls.  Family fighting (domestic violence).  Drowning.  Fights.  Injuries.  Burns.  Damage to certain organs.  Having a baby with birth defects. ONE DRINK CAN BE TOO MUCH WHEN YOU ARE:  Working.  Pregnant or breastfeeding.  Taking medicines. Ask your doctor.  Driving or planning to drive. If you or someone you know has a drinking problem, get help from a doctor.  Document Released: 11/25/2008 Document Revised: 04/23/2011 Document Reviewed: 11/25/2008 ExitCare Patient Information 2013 [employer]Care[employer].   Smoking Hazards Smoking cigarettes is extremely bad for your health. Tobacco smoke has over 200 known poisons in it. There are over 60 chemicals in tobacco smoke that cause cancer. Some of the chemicals found in cigarette smoke include:   Cyanide.  Benzene.  Formaldehyde.  Methanol (wood alcohol).  Acetylene (fuel used in welding torches).  Ammonia. Cigarette smoke also contains the poisonous gases nitrogen oxide and carbon monoxide.  Cigarette smokers have an increased risk of many serious medical problems and Smoking causes approximately:  90% of all lung cancer deaths in men.  80% of all lung cancer deaths in women.  90% of deaths from chronic obstructive lung disease. Compared with nonsmokers, smoking increases the risk of:  Coronary heart disease by 2 to 4 times.  Stroke by 2 to 4 times.  Men developing lung cancer by 23 times.  Women developing lung cancer by 13 times.  Dying from chronic obstructive lung diseases by 12 times.  . Smoking is the most preventable cause of death and disease in our society.  WHY IS SMOKING ADDICTIVE?  Nicotine is the chemical  agent in tobacco that is capable of causing addiction or dependence.  When you smoke and inhale, nicotine is absorbed rapidly into the bloodstream through your lungs. Nicotine absorbed through the lungs is capable of creating a powerful addiction. Both inhaled and non-inhaled nicotine may be addictive.  Addiction studies of cigarettes and spit tobacco show that addiction to nicotine occurs mainly during the teen years, when young people begin using tobacco products. WHAT ARE THE BENEFITS OF QUITTING?  There are many health benefits to quitting smoking.   Likelihood of developing cancer and heart disease decreases. Health improvements are seen almost immediately.  Blood pressure, pulse rate, and breathing patterns start returning to normal soon after quitting. QUITTING SMOKING   American Lung Association - 1-800-LUNGUSA  American Cancer Society - 1-800-ACS-2345 Document Released: 03/08/2004 Document Revised: 04/23/2011 Document Reviewed: 11/10/2008 ExitCare Patient Information 2013 ExitCare,   LLC.   Stress Management Stress is a state of physical or mental tension that often results from changes in your life or normal routine. Some common causes of stress are:  Death of a loved one.  Injuries or severe illnesses.  Getting fired or changing jobs.  Moving into a new home. Other causes may be:  Sexual problems.  Business or financial losses.  Taking on a large debt.  Regular conflict with someone at home or at work.  Constant tiredness from lack of sleep. It is not just bad things that are stressful. It may be stressful to:  Win the lottery.  Get married.  Buy a new car. The amount of stress that can be easily tolerated varies from person to person. Changes generally cause stress, regardless of the types of change. Too much stress can affect your health. It may lead to physical or emotional problems. Too little stress (boredom) may also become stressful. SUGGESTIONS TO  REDUCE STRESS:  Talk things over with your family and friends. It often is helpful to share your concerns and worries. If you feel your problem is serious, you may want to get help from a professional counselor.  Consider your problems one at a time instead of lumping them all together. Trying to take care of everything at once may seem impossible. List all the things you need to do and then start with the most important one. Set a goal to accomplish 2 or 3 things each day. If you expect to do too many in a single day you will naturally fail, causing you to feel even more stressed.  Do not use alcohol or drugs to relieve stress. Although you may feel better for a short time, they do not remove the problems that caused the stress. They can also be habit forming.  Exercise regularly - at least 3 times per week. Physical exercise can help to relieve that "uptight" feeling and will relax you.  The shortest distance between despair and hope is often a good night's sleep.  Go to bed and get up on time allowing yourself time for appointments without being rushed.  Take a short "time-out" period from any stressful situation that occurs during the day. Close your eyes and take some deep breaths. Starting with the muscles in your face, tense them, hold it for a few seconds, then relax. Repeat this with the muscles in your neck, shoulders, hand, stomach, back and legs.  Take good care of yourself. Eat a balanced diet and get plenty of rest.  Schedule time for having fun. Take a break from your daily routine to relax. HOME CARE INSTRUCTIONS   Call if you feel overwhelmed by your problems and feel you can no longer manage them on your own.  Return immediately if you feel like hurting yourself or someone else. Document Released: 07/25/2000 Document Revised: 04/23/2011 Document Reviewed: 03/17/2007 ExitCare Patient Information 2013 ExitCare, LLC.  

## 2016-08-13 ENCOUNTER — Ambulatory Visit (INDEPENDENT_AMBULATORY_CARE_PROVIDER_SITE_OTHER): Payer: 59 | Admitting: Obstetrics and Gynecology

## 2016-08-13 ENCOUNTER — Telehealth: Payer: Self-pay | Admitting: Nurse Practitioner

## 2016-08-13 ENCOUNTER — Encounter: Payer: Self-pay | Admitting: Obstetrics and Gynecology

## 2016-08-13 VITALS — BP 140/80 | HR 88 | Resp 16 | Wt 201.0 lb

## 2016-08-13 DIAGNOSIS — R350 Frequency of micturition: Secondary | ICD-10-CM | POA: Diagnosis not present

## 2016-08-13 DIAGNOSIS — R300 Dysuria: Secondary | ICD-10-CM | POA: Diagnosis not present

## 2016-08-13 LAB — POCT URINALYSIS DIPSTICK
Bilirubin, UA: NEGATIVE
GLUCOSE UA: NEGATIVE
Ketones, UA: NEGATIVE
PROTEIN UA: NEGATIVE
UROBILINOGEN UA: NEGATIVE U/dL — AB
pH, UA: 6.5 (ref 5.0–8.0)

## 2016-08-13 MED ORDER — SULFAMETHOXAZOLE-TRIMETHOPRIM 800-160 MG PO TABS: 1.0000 | ORAL_TABLET | Freq: Two times a day (BID) | ORAL | 0 refills | Status: DC

## 2016-08-13 MED ORDER — PHENAZOPYRIDINE HCL 200 MG PO TABS: 200.0000 mg | ORAL_TABLET | Freq: Three times a day (TID) | ORAL | 0 refills | Status: DC | PRN

## 2016-08-13 NOTE — Telephone Encounter (Signed)
Spoke with patient. Patient states that on 08/11/2016 she began having dysuria. Reports pain is worsening and would like to be seen for evaluation. Denies fever, chills, or lower back pain. Appointment scheduled for 08/13/2016 at 10 am with Dr.Jertson. Patient is agreeable to date, time, and to see another provider.  Routing to provider for final review. Patient agreeable to disposition. Will close encounter.

## 2016-08-13 NOTE — Addendum Note (Signed)
Addended by: Shelda JakesHANNER, Darden Flemister E on: 08/13/2016 10:59 AM   Modules accepted: Orders

## 2016-08-13 NOTE — Patient Instructions (Signed)

## 2016-08-13 NOTE — Telephone Encounter (Signed)
Patient has a UTI and wants to see PG.

## 2016-08-13 NOTE — Progress Notes (Signed)
GYNECOLOGY  VISIT   HPI: 26 y.o.   Single  Caucasian  female   G0P0000 with Patient's last menstrual period was 07/31/2016.   here c/o dysuria and urinary frequency. Her symptoms started 4-5 days ago, mild "twinging" in her pelvic floor. Then she realized it was her urethra was tender. She c/o urinary frequency and urgency, getting worse in the last few days. Burns or hurts at the end of voiding, not terrible pain. Symptoms have improved with uristat.  No vulvar c/o, no vaginal d/c.  No fevers, no current flank pain.    No current vaginal bleeding. She has never had intercourse. No current partner. She was recently on a road trip and held her bladder too long.   GYNECOLOGIC HISTORY: Patient's last menstrual period was 07/31/2016. Contraception:IUD Menopausal hormone therapy: none         OB History    Gravida Para Term Preterm AB Living   0 0 0 0 0 0   SAB TAB Ectopic Multiple Live Births   0 0 0 0 0         Patient Active Problem List   Diagnosis Date Noted  . Recurrent tonsillitis 06/11/2016  . Eczema 09/14/2015  . Depression with anxiety 03/01/2014  . ANKLE PAIN, LEFT 07/20/2009  . ROTATOR CUFF INJURY, LEFT SHOULDER 07/20/2009  . OTHER ACQUIRED DEFORMITY OF ANKLE AND FOOT OTHER 07/20/2009  . AMENORRHEA, PRIMARY 09/10/2008  . CERVICAL LYMPHADENOPATHY 09/10/2008    Past Medical History:  Diagnosis Date  . Anemia   . Anxiety   . Depression   . IBS (irritable bowel syndrome)     Past Surgical History:  Procedure Laterality Date  . INTRAUTERINE DEVICE (IUD) INSERTION  07/05/15   Skyla     Current Outpatient Prescriptions  Medication Sig Dispense Refill  . FLUoxetine (PROZAC) 20 MG tablet Take 1 tablet (20 mg total) by mouth daily. 90 tablet 3  . Levonorgestrel (SKYLA) 13.5 MG IUD by Intrauterine route. Inserted 07/05/15    . Multiple Vitamins-Minerals (MULTIVITAMIN PO) Take by mouth daily.    . pregabalin (LYRICA) 150 MG capsule Take 150 mg by mouth daily as needed  (pain).    . temazepam (RESTORIL) 7.5 MG capsule Take 7.5 mg by mouth at bedtime as needed for sleep.     No current facility-administered medications for this visit.      ALLERGIES: Benzoyl peroxide  Family History  Problem Relation Age of Onset  . Anemia Mother   . Hypertension Mother   . Diabetes Mellitus II Paternal Grandmother 33  . Breast cancer Paternal Grandmother 46       lumpectomy and 2 months of chemo  . Heart failure Maternal Grandfather   . Heart attack Maternal Grandfather   . Stroke Maternal Grandmother   . Diabetes Paternal Grandfather   . Depression Unknown   . Melanoma Unknown        PGF  . Hypertension Unknown        Maternal Side   . Stroke Maternal Aunt   . Heart attack Maternal Aunt     Social History   Social History  . Marital status: Single    Spouse name: N/A  . Number of children: N/A  . Years of education: N/A   Occupational History  . Not on file.   Social History Main Topics  . Smoking status: Never Smoker  . Smokeless tobacco: Never Used  . Alcohol use 0.6 - 2.4 oz/week    1 - 4  Standard drinks or equivalent per week     Comment: occ, rare  . Drug use: No  . Sexual activity: Not Currently    Partners: Male    Birth control/ protection: IUD     Comment: Insertion 07/05/15   Other Topics Concern  . Not on file   Social History Narrative  . No narrative on file    Review of Systems  Constitutional: Negative.   HENT: Negative.   Eyes: Negative.   Respiratory: Negative.   Cardiovascular: Negative.   Gastrointestinal: Negative.   Genitourinary: Positive for dysuria, frequency and urgency.  Musculoskeletal: Negative.   Skin: Negative.   Neurological: Negative.   Endo/Heme/Allergies: Negative.   Psychiatric/Behavioral: Negative.     PHYSICAL EXAMINATION:    BP 140/80 (BP Location: Right Arm, Patient Position: Sitting, Cuff Size: Normal)   Pulse 88   Resp 16   Wt 201 lb (91.2 kg)   LMP 07/31/2016   BMI 30.79 kg/m      General appearance: alert, cooperative and appears stated age Abdomen: soft, minimally tender in the suprapubic region. No masses,  no organomegaly CVA: not tender   Urine dip: +++ blood, small leuk.   ASSESSMENT UTI    PLAN Bactrim x 3 days Pyridium x 2 days Call with fevers, flank pain or any other concerns   An After Visit Summary was printed and given to the patient.

## 2016-08-15 LAB — URINE CULTURE

## 2016-08-28 ENCOUNTER — Telehealth: Payer: Self-pay | Admitting: Family Medicine

## 2016-08-28 ENCOUNTER — Ambulatory Visit (INDEPENDENT_AMBULATORY_CARE_PROVIDER_SITE_OTHER): Payer: 59 | Admitting: Family Medicine

## 2016-08-28 ENCOUNTER — Other Ambulatory Visit: Payer: Self-pay | Admitting: Family Medicine

## 2016-08-28 DIAGNOSIS — Z23 Encounter for immunization: Secondary | ICD-10-CM | POA: Diagnosis not present

## 2016-08-28 NOTE — Telephone Encounter (Signed)
Can you call pt to schedule injection? Dr. Clent RidgesFry does recommend.

## 2016-08-28 NOTE — Telephone Encounter (Signed)
Pt would like to have the Gardisal vaccine is it okay for her to come in to have this without seeing Dr. Clent RidgesFry?  Pt would like to come in today if it is okay.

## 2016-08-28 NOTE — Telephone Encounter (Signed)
Pt has been scheduled.  °

## 2016-08-28 NOTE — Telephone Encounter (Signed)
Call in #[phone] with one rf 

## 2016-08-29 ENCOUNTER — Other Ambulatory Visit: Payer: Self-pay | Admitting: Family Medicine

## 2016-08-29 NOTE — Telephone Encounter (Signed)
I called in script 

## 2016-09-17 ENCOUNTER — Ambulatory Visit: Payer: 59 | Admitting: Clinical

## 2016-09-25 ENCOUNTER — Ambulatory Visit (INDEPENDENT_AMBULATORY_CARE_PROVIDER_SITE_OTHER): Payer: 59

## 2016-09-25 DIAGNOSIS — Z23 Encounter for immunization: Secondary | ICD-10-CM | POA: Diagnosis not present

## 2016-10-08 ENCOUNTER — Encounter: Payer: Self-pay | Admitting: Family Medicine

## 2016-10-08 ENCOUNTER — Ambulatory Visit (INDEPENDENT_AMBULATORY_CARE_PROVIDER_SITE_OTHER): Payer: 59 | Admitting: Family Medicine

## 2016-10-08 VITALS — BP 130/90 | HR 82 | Temp 98.5°F | Ht 67.75 in | Wt 201.0 lb

## 2016-10-08 DIAGNOSIS — J0391 Acute recurrent tonsillitis, unspecified: Secondary | ICD-10-CM

## 2016-10-08 MED ORDER — AMOXICILLIN-POT CLAVULANATE 875-125 MG PO TABS: 1.0000 | ORAL_TABLET | Freq: Two times a day (BID) | ORAL | 0 refills | Status: DC

## 2016-10-08 NOTE — Patient Instructions (Signed)
WE NOW OFFER   [facility] FAST TRACK!!!  SAME DAY Appointments for ACUTE CARE  Such as: Sprains, Injuries, cuts, abrasions, rashes, muscle pain, joint pain, back pain Colds, flu, sore throats, headache, allergies, cough, fever  Ear pain, sinus and eye infections Abdominal pain, nausea, vomiting, diarrhea, upset stomach Animal/insect bites  3 Easy Ways to Schedule: Walk-In Scheduling Call in scheduling Mychart Sign-up: [email or web address]         

## 2016-10-08 NOTE — Progress Notes (Signed)
   Subjective:    Patient ID: April Rivas, female    DOB: 01/20/1991, 26 y.o.   MRN: [id number]  HPI Here for another bout of tonsillitis. We treated her for this in March and again in April of this year. Now for the past 10 days she has ad a swollen and painful tonsil on the right side with swollen neck nodes. No fever.    Review of Systems  Constitutional: Negative.   HENT: Positive for sore throat and trouble swallowing. Negative for congestion, ear pain, postnasal drip, sinus pain, sinus pressure and voice change.   Eyes: Negative.   Respiratory: Negative.        Objective:   Physical Exam  Constitutional: She appears well-developed and well-nourished. No distress.  HENT:  Right Ear: External ear normal.  Left Ear: External ear normal.  Nose: Nose normal.  Right tonsil is red and swollen, no exudate   Eyes: Conjunctivae are normal.  Neck: Neck supple. No thyromegaly present.  Large tender node under the right mandible angle   Pulmonary/Chest: Effort normal and breath sounds normal. No respiratory distress. She has no wheezes. She has no rales.  Lymphadenopathy:    She has cervical adenopathy.          Assessment & Plan:  Recurrent tonsillitis. Treat with Augmentin. Refer to ENT.  Gershon Crane, MD

## 2016-11-01 ENCOUNTER — Encounter: Payer: Self-pay | Admitting: Family Medicine

## 2016-12-04 DIAGNOSIS — J3503 Chronic tonsillitis and adenoiditis: Secondary | ICD-10-CM | POA: Diagnosis not present

## 2016-12-04 DIAGNOSIS — J353 Hypertrophy of tonsils with hypertrophy of adenoids: Secondary | ICD-10-CM | POA: Diagnosis not present

## 2017-01-07 ENCOUNTER — Telehealth: Payer: Self-pay | Admitting: Obstetrics and Gynecology

## 2017-01-07 NOTE — Telephone Encounter (Signed)
Spoke with patient. Nipple pierced in Jan 2018. 2 weeks ago right nipple swollen, draining yellow "pus". Has been using sea salt soaks and washing bid with dial soap for 1 week.   Reports no pus, nipple is still sore and tender. Piercing still in place, removes to clean.   Denies fever/chills, N/V.   Recommended OV for further evaluation. OV scheduled for 01/08/17 at 4pm with Dr. Oscar LaJertson. Advised patient Dr. Oscar LaJertson will review, I will return call with any additional recommendations. Patient is agreeable.   Routing to provider for final review. Patient is agreeable to disposition. Will close encounter.

## 2017-01-07 NOTE — Telephone Encounter (Signed)
Patient called with concerns she may have an infected right nipple that is pierced.

## 2017-01-08 ENCOUNTER — Encounter: Payer: Self-pay | Admitting: Obstetrics and Gynecology

## 2017-01-08 ENCOUNTER — Ambulatory Visit: Payer: 59 | Admitting: Obstetrics and Gynecology

## 2017-01-08 ENCOUNTER — Other Ambulatory Visit: Payer: Self-pay

## 2017-01-08 VITALS — BP 132/80 | HR 92 | Resp 16 | Wt 194.0 lb

## 2017-01-08 DIAGNOSIS — N6310 Unspecified lump in the right breast, unspecified quadrant: Secondary | ICD-10-CM | POA: Diagnosis not present

## 2017-01-08 NOTE — Progress Notes (Signed)
GYNECOLOGY  VISIT   HPI: 26 y.o.   Single  Caucasian  female   G0P0000 with Patient's last menstrual period was 12/19/2016.   here c/o right nipple pain. She had her nipple pierced in 1/18. 2 weeks ago she noticed some discharge from the piercing area, thought it was pus. She saw the person who did the piercing, has been doing salt water soaks. Not draining any more, now very tender, she is worried she may have a abscess under the nipple. She has had multiple throat infections, getting a tonsillectomy in 2-3 weeks. No current fevers.      GYNECOLOGIC HISTORY: Patient's last menstrual period was 12/19/2016. Contraception:IUD Menopausal hormone therapy: none         OB History    Gravida Para Term Preterm AB Living   0 0 0 0 0 0   SAB TAB Ectopic Multiple Live Births   0 0 0 0 0         Patient Active Problem List   Diagnosis Date Noted  . Recurrent tonsillitis 06/11/2016  . Eczema 09/14/2015  . Depression with anxiety 03/01/2014  . ANKLE PAIN, LEFT 07/20/2009  . ROTATOR CUFF INJURY, LEFT SHOULDER 07/20/2009  . OTHER ACQUIRED DEFORMITY OF ANKLE AND FOOT OTHER 07/20/2009  . AMENORRHEA, PRIMARY 09/10/2008  . CERVICAL LYMPHADENOPATHY 09/10/2008    Past Medical History:  Diagnosis Date  . Anemia   . Anxiety   . Depression   . IBS (irritable bowel syndrome)     Past Surgical History:  Procedure Laterality Date  . INTRAUTERINE DEVICE (IUD) INSERTION  07/05/15   Skyla     Current Outpatient Medications  Medication Sig Dispense Refill  . FLUoxetine (PROZAC) 20 MG tablet TAKE 1 TABLET BY MOUTH ONCE DAILY 90 tablet 3  . Levonorgestrel (SKYLA) 13.5 MG IUD by Intrauterine route. Inserted 07/05/15    . Multiple Vitamins-Minerals (MULTIVITAMIN PO) Take by mouth daily.    . pregabalin (LYRICA) 150 MG capsule Take 150 mg by mouth daily as needed (pain).    . temazepam (RESTORIL) 7.5 MG capsule Take 7.5 mg by mouth at bedtime as needed for sleep.     No current  facility-administered medications for this visit.      ALLERGIES: Benzoyl peroxide  Family History  Problem Relation Age of Onset  . Anemia Mother   . Hypertension Mother   . Diabetes Mellitus II Paternal Grandmother [phone]  . Breast cancer Paternal Grandmother [phone]       lumpectomy and 2 months of chemo  . Heart failure Maternal Grandfather   . Heart attack Maternal Grandfather   . Stroke Maternal Grandmother   . Diabetes Paternal Grandfather   . Depression Unknown   . Melanoma Unknown        PGF  . Hypertension Unknown        Maternal Side   . Stroke Maternal Aunt   . Heart attack Maternal Aunt     Social History   Socioeconomic History  . Marital status: Single    Spouse name: Not on file  . Number of children: Not on file  . Years of education: Not on file  . Highest education level: Not on file  Social Needs  . Financial resource strain: Not on file  . Food insecurity - worry: Not on file  . Food insecurity - inability: Not on file  . Transportation needs - medical: Not on file  . Transportation needs - non-medical: Not on file  Occupational History  .  Not on file  Tobacco Use  . Smoking status: Never Smoker  . Smokeless tobacco: Never Used  Substance and Sexual Activity  . Alcohol use: Yes    Alcohol/week: 0.6 - 2.4 oz    Types: 1 - 4 Standard drinks or equivalent per week    Comment: occ, rare  . Drug use: No  . Sexual activity: Not Currently    Partners: Male    Birth control/protection: IUD    Comment: Insertion 07/05/15  Other Topics Concern  . Not on file  Social History Narrative  . Not on file    Review of Systems  Constitutional: Negative.   HENT: Negative.   Eyes: Negative.   Respiratory: Negative.   Cardiovascular: Negative.   Gastrointestinal: Negative.   Genitourinary:       Right nipple pain and drainage  Musculoskeletal: Negative.   Skin: Negative.   Neurological: Negative.   Endo/Heme/Allergies: Negative.   Psychiatric/Behavioral:  Negative.     PHYSICAL EXAMINATION:    BP 132/80 (BP Location: Right Arm, Patient Position: Sitting, Cuff Size: Normal)   Pulse 92   Resp 16   Wt 194 lb (88 kg)   LMP 12/19/2016   BMI 29.72 kg/m     General appearance: alert, cooperative and appears stated age Breasts: 1.5 cm tender lump under the right nipple, no surrounding erythema, no current drainage, some crusty d/c on her nipple piercing. No clear pus.     ASSESSMENT Tender right breast lump under the nipple Recent c/o pus like d/c from her nipple piercing, no longer occurring No surrounding erythema, not clearly an abscess    PLAN Breast ultrasound Call with fever, erythema, recurrent drainage.    An After Visit Summary was printed and given to the patient.

## 2017-01-08 NOTE — Progress Notes (Signed)
Scheduled patient while in office for [facility] on 01[facility]10/2017 at 9:40 am with 9:20 am arrival. Patient is agreeable to date and time.  Placed in mammogram hold.

## 2017-01-10 ENCOUNTER — Telehealth: Payer: Self-pay | Admitting: Obstetrics and Gynecology

## 2017-01-10 ENCOUNTER — Ambulatory Visit
Admission: RE | Admit: 2017-01-10 | Discharge: 2017-01-10 | Disposition: A | Discharge status: Home / Self Care | Payer: 59 | Source: Ambulatory Visit | Attending: Obstetrics and Gynecology | Admitting: Obstetrics and Gynecology

## 2017-01-10 DIAGNOSIS — N6489 Other specified disorders of breast: Secondary | ICD-10-CM | POA: Diagnosis not present

## 2017-01-10 DIAGNOSIS — N6310 Unspecified lump in the right breast, unspecified quadrant: Secondary | ICD-10-CM

## 2017-01-10 NOTE — Telephone Encounter (Signed)
Spoke with patient. Patient states that she spoke with her ENT and was cleared to start on antibiotics. States she reviewed this with [facility] and was started on antibiotics. States the radiologist was unsure if the area could be drained due to the location and toughness of the skin. Felt there may not be fluid to drain. Per patient. Radiologist is okay with her completing antibiotics only. Was advised to seek care with PCP if signs of infection or enlargement occur.

## 2017-01-10 NOTE — Telephone Encounter (Signed)
I would recommend that she have the suspected abscess drained. She should also get antibiotics with drainage, I believe radiology does this (can you please confirm) Is she requesting an appointment for vaginitis?

## 2017-01-10 NOTE — Telephone Encounter (Signed)
Routing to Dr.Jertson for review and advise. Final report is not yet available in Epic.

## 2017-01-10 NOTE — Telephone Encounter (Signed)
Spoke with patient and offered to make follow up appointment next week but patient has finals and unable to come in next week. Made appointment 01-22-17 3:15pm and she knows to call if symptoms worsen. Routed to provider

## 2017-01-10 NOTE — Telephone Encounter (Addendum)
Report available in Epic. 1.7 cm abscess versus complicated cyst directly behind the right nipple, corresponding to the patient's palpable abnormality. Option of ultrasound-guided needle drainage was discussed with the patient.

## 2017-01-10 NOTE — Telephone Encounter (Signed)
Can you please set her up for a f/u appointment here next week

## 2017-01-10 NOTE — Telephone Encounter (Signed)
Patient called request some guidance from Dr. Oscar LaJertson. She was seen [facility] this morning and said they found a 1 cm cyst but they were not sure whether to aspirate it or not. Patient has also has a tonsillectomy in 3 weeks and was told to call our office to check in with the doctor for recommendations. She will also be checking in with her surgeon.  Last seen: 01/08/17

## 2017-01-12 HISTORY — PX: TONSILLECTOMY AND ADENOIDECTOMY: SHX28

## 2017-01-22 ENCOUNTER — Ambulatory Visit: Payer: 59 | Admitting: Obstetrics and Gynecology

## 2017-01-22 ENCOUNTER — Encounter: Payer: Self-pay | Admitting: Obstetrics and Gynecology

## 2017-01-22 ENCOUNTER — Other Ambulatory Visit: Payer: Self-pay

## 2017-01-22 VITALS — BP 132/70 | HR 84 | Resp 14 | Wt 191.0 lb

## 2017-01-22 DIAGNOSIS — N6310 Unspecified lump in the right breast, unspecified quadrant: Secondary | ICD-10-CM | POA: Diagnosis not present

## 2017-01-22 NOTE — Progress Notes (Signed)
Scheduled patient for R breast ultrasound on 02[facility]11/2017 at 12[facility]. Patient declines earlier appointment due to upcoming finals and surgery. Aware if the area continues to grow or develops new symptoms needs to be seen for further evaluation. Placed in imaging hold.

## 2017-01-22 NOTE — Progress Notes (Signed)
GYNECOLOGY  VISIT   HPI: 26 y.o.   Single  Caucasian  female   G0P0000 with Patient's last menstrual period was 01/14/2017.   here for right breast recheck. The patient was seen a few weeks ago with a breast lump, possible abscess. She had a breast ultrasound that revealed a 1.6 x 0.9 x 1.7 cm abscess vs complicated cyst behind the right nipple. The patient was treated with antibiotics. The pain has resolved, but the lump may be a little bigger. She has 2 pills left of her 10 days of antibiotics. Thinks it may be dicloxacillin.   GYNECOLOGIC HISTORY: Patient's last menstrual period was 01/14/2017. Contraception:IUD Menopausal hormone therapy: none         OB History    Gravida Para Term Preterm AB Living   0 0 0 0 0 0   SAB TAB Ectopic Multiple Live Births   0 0 0 0 0         Patient Active Problem List   Diagnosis Date Noted  . Recurrent tonsillitis 06/11/2016  . Eczema 09/14/2015  . Depression with anxiety 03/01/2014  . ANKLE PAIN, LEFT 07/20/2009  . ROTATOR CUFF INJURY, LEFT SHOULDER 07/20/2009  . OTHER ACQUIRED DEFORMITY OF ANKLE AND FOOT OTHER 07/20/2009  . AMENORRHEA, PRIMARY 09/10/2008  . CERVICAL LYMPHADENOPATHY 09/10/2008    Past Medical History:  Diagnosis Date  . Anemia   . Anxiety   . Depression   . IBS (irritable bowel syndrome)     Past Surgical History:  Procedure Laterality Date  . INTRAUTERINE DEVICE (IUD) INSERTION  07/05/15   Skyla     Current Outpatient Medications  Medication Sig Dispense Refill  . FLUoxetine (PROZAC) 20 MG tablet TAKE 1 TABLET BY MOUTH ONCE DAILY 90 tablet 3  . Levonorgestrel (SKYLA) 13.5 MG IUD by Intrauterine route. Inserted 07/05/15    . Multiple Vitamins-Minerals (MULTIVITAMIN PO) Take by mouth daily.    . pregabalin (LYRICA) 150 MG capsule Take 150 mg by mouth daily as needed (pain).    . temazepam (RESTORIL) 7.5 MG capsule Take 7.5 mg by mouth at bedtime as needed for sleep.     No current facility-administered  medications for this visit.      ALLERGIES: Benzoyl peroxide  Family History  Problem Relation Age of Onset  . Anemia Mother   . Hypertension Mother   . Diabetes Mellitus II Paternal Grandmother [phone]  . Breast cancer Paternal Grandmother [phone]       lumpectomy and 2 months of chemo  . Heart failure Maternal Grandfather   . Heart attack Maternal Grandfather   . Stroke Maternal Grandmother   . Diabetes Paternal Grandfather   . Depression Unknown   . Melanoma Unknown        PGF  . Hypertension Unknown        Maternal Side   . Stroke Maternal Aunt   . Heart attack Maternal Aunt     Social History   Socioeconomic History  . Marital status: Single    Spouse name: Not on file  . Number of children: Not on file  . Years of education: Not on file  . Highest education level: Not on file  Social Needs  . Financial resource strain: Not on file  . Food insecurity - worry: Not on file  . Food insecurity - inability: Not on file  . Transportation needs - medical: Not on file  . Transportation needs - non-medical: Not on file  Occupational History  .  Not on file  Tobacco Use  . Smoking status: Never Smoker  . Smokeless tobacco: Never Used  Substance and Sexual Activity  . Alcohol use: Yes    Alcohol/week: 0.6 - 2.4 oz    Types: 1 - 4 Standard drinks or equivalent per week    Comment: occ, rare  . Drug use: No  . Sexual activity: Not Currently    Partners: Male    Birth control/protection: IUD    Comment: Insertion 07/05/15  Other Topics Concern  . Not on file  Social History Narrative  . Not on file    Review of Systems  Constitutional: Negative.   HENT: Negative.   Eyes: Negative.   Respiratory: Negative.   Cardiovascular: Negative.   Gastrointestinal: Negative.   Genitourinary:       Right breast lump  Musculoskeletal: Negative.   Skin: Negative.   Neurological: Negative.   Endo/Heme/Allergies: Negative.   Psychiatric/Behavioral: Negative.     PHYSICAL  EXAMINATION:    BP 132/70 (BP Location: Right Arm, Patient Position: Sitting, Cuff Size: Normal)   Pulse 84   Resp 14   Wt 191 lb (86.6 kg)   LMP 01/14/2017   BMI 29.26 kg/m     General appearance: alert, cooperative and appears stated age Breasts: under the right nipple is an approximately 3 cm, not tender lump. It is larger than on her prior exam. No other changes.   ASSESSMENT Right breast lump, increasing in size, suspected abscess. With antibiotics her pain has resolved.      PLAN Will send her for repeat ultrasound imaging   An After Visit Summary was printed and given to the patient.

## 2017-01-25 DIAGNOSIS — J3503 Chronic tonsillitis and adenoiditis: Secondary | ICD-10-CM | POA: Diagnosis not present

## 2017-01-25 DIAGNOSIS — J353 Hypertrophy of tonsils with hypertrophy of adenoids: Secondary | ICD-10-CM | POA: Diagnosis not present

## 2017-01-25 DIAGNOSIS — J312 Chronic pharyngitis: Secondary | ICD-10-CM | POA: Diagnosis not present

## 2017-01-25 DIAGNOSIS — J3501 Chronic tonsillitis: Secondary | ICD-10-CM | POA: Diagnosis not present

## 2017-02-11 ENCOUNTER — Other Ambulatory Visit: Payer: Self-pay

## 2017-02-20 ENCOUNTER — Other Ambulatory Visit: Payer: Self-pay

## 2017-03-04 ENCOUNTER — Ambulatory Visit
Admission: RE | Admit: 2017-03-04 | Discharge: 2017-03-04 | Disposition: A | Discharge status: Home / Self Care | Payer: 59 | Source: Ambulatory Visit | Attending: Obstetrics and Gynecology | Admitting: Obstetrics and Gynecology

## 2017-03-04 ENCOUNTER — Telehealth: Payer: Self-pay

## 2017-03-04 DIAGNOSIS — N6310 Unspecified lump in the right breast, unspecified quadrant: Secondary | ICD-10-CM

## 2017-03-04 NOTE — Telephone Encounter (Signed)
-----   Message from Romualdo BolkJill Evelyn Jertson, MD sent at 03/04/2017 12:15 PM EST ----- Imaging is negative, take off of mammogram hold. Please set the patient up for a f/u breast exam at the end of February. We want to make sure the lump is remaining stable.

## 2017-03-04 NOTE — Telephone Encounter (Signed)
Left message to call Kinnedy Mongiello at [phone]. 

## 2017-03-15 ENCOUNTER — Telehealth: Payer: Self-pay | Admitting: Family Medicine

## 2017-03-15 NOTE — Telephone Encounter (Signed)
Copied from CRM [id number]. Topic: Inquiry >> Mar 15, 2017  3:29 PM Raquel SarnaHayes, Teresa G wrote: Temazepam 30  Pharmacy doesn't have the 30 mg.  They need an approval for pt to have 15mg .  Pt is out of medication since this has been happening.  [id number] - [place], [place] - [address] [place] [address] Phone: [phone] Fax: [phone]

## 2017-03-15 NOTE — Telephone Encounter (Signed)
Dr. Fry - Please advise. Thanks! 

## 2017-03-15 NOTE — Telephone Encounter (Signed)
Pharmacy is requesting to give patient decreased dose- because they are out of 30 mg dosing. Please call pharmacy to discuss accommodating the patient.

## 2017-03-19 MED ORDER — TEMAZEPAM 15 MG PO CAPS: 30.0000 mg | ORAL_CAPSULE | Freq: Every day | ORAL | 5 refills | Status: DC

## 2017-03-19 NOTE — Telephone Encounter (Signed)
Change this to Temazepam 15 mg to take 2 qhs. Call in #[phone] with 5 rf

## 2017-03-19 NOTE — Telephone Encounter (Signed)
Called in Rx for pt.  

## 2017-03-22 ENCOUNTER — Telehealth: Payer: Self-pay | Admitting: Obstetrics and Gynecology

## 2017-03-22 ENCOUNTER — Encounter: Payer: Self-pay | Admitting: Family Medicine

## 2017-03-22 ENCOUNTER — Ambulatory Visit: Payer: 59 | Admitting: Family Medicine

## 2017-03-22 VITALS — BP 130/72 | HR 90 | Temp 98.6°F | Wt 195.0 lb

## 2017-03-22 DIAGNOSIS — Z23 Encounter for immunization: Secondary | ICD-10-CM

## 2017-03-22 DIAGNOSIS — N63 Unspecified lump in unspecified breast: Secondary | ICD-10-CM | POA: Diagnosis not present

## 2017-03-22 DIAGNOSIS — F418 Other specified anxiety disorders: Secondary | ICD-10-CM | POA: Diagnosis not present

## 2017-03-22 DIAGNOSIS — Z9229 Personal history of other drug therapy: Secondary | ICD-10-CM

## 2017-03-22 MED ORDER — DOXYCYCLINE HYCLATE 100 MG PO CAPS: 100.0000 mg | ORAL_CAPSULE | Freq: Two times a day (BID) | ORAL | 0 refills | Status: AC

## 2017-03-22 MED ORDER — LORAZEPAM 0.5 MG PO TABS: 0.5000 mg | ORAL_TABLET | Freq: Three times a day (TID) | ORAL | 2 refills | Status: DC | PRN

## 2017-03-22 NOTE — Progress Notes (Signed)
   Subjective:    Patient ID: April Rivas, female    DOB: 07/30/1990, 27 y.o.   MRN: [id number]  HPI Here for several issues. First her anxiety has been more of a problem for the past 3 months. She is always anxious and worried, and she has had more frequent "panic attacks". Her sleep is intact. She still takes Prozac daily but she asks for something additional to use as needed. Second for the past 3 days she has had a painful lump in the left breast. No DC or fever. She was treated by her GYN last month for an abscess in the other breast, and this one acts in a similar manner. She already has an appt with her GYN next Tuesday.    Review of Systems  Constitutional: Negative.   Respiratory: Negative.   Cardiovascular: Negative.   Psychiatric/Behavioral: Negative for confusion, dysphoric mood, hallucinations and sleep disturbance. The patient is nervous/anxious.        Objective:   Physical Exam  Constitutional: She is oriented to person, place, and time. She appears well-developed and well-nourished.  Cardiovascular: Normal rate, regular rhythm, normal heart sounds and intact distal pulses.  Pulmonary/Chest: Effort normal and breath sounds normal. No respiratory distress. She has no wheezes. She has no rales.  Left breast has an indurated tender area inferior to the nipple at 6 o'clock   Neurological: She is alert and oriented to person, place, and time.  Psychiatric: She has a normal mood and affect. Her behavior is normal. Thought content normal.          Assessment & Plan:  She has a breast mass, probably another abscess. We will start her on Doxycyline and she can use warm compresses. Follow up with her GYN next week as scheduled. She could get an US or other imaging if needed at that time. For the anxiety she will stay on Prozac 20 mg daily and add Lorazepam 0.5 mg as needed. I gave her info about [facility] and urged her to meet with a therapist asap.  Gershon CraneStephen  Cruise Baumgardner, MD

## 2017-03-22 NOTE — Telephone Encounter (Signed)
Patient state she has a lump on left breast that is large, red and painful. Requests appointment for today.

## 2017-03-22 NOTE — Telephone Encounter (Signed)
Spoke with patient. Patient states that she has a left breast mass that she noticed 2 days ago. Mass has gotten larger and is red today. Is tender to the touch. Reports she had an abscess in her right breast that started this way as well. Advised will need to be seen for further evaluation. Patient is unable to be seen in our office today as she is at her PCP for another visit. Advised to let her PCP evaluate the area while she is there to ensure this is evaluated before the weekend. Patient is agreeable. Appointment scheduled here for 03/26/2017 at 10:30 am with Dr.Jertson. Advised if PCP treats symptoms and does not need this appointment to return call.  Routing to provider for final review. Patient agreeable to disposition. Will close encounter.

## 2017-03-26 ENCOUNTER — Ambulatory Visit (INDEPENDENT_AMBULATORY_CARE_PROVIDER_SITE_OTHER): Payer: 59 | Admitting: Obstetrics and Gynecology

## 2017-03-26 ENCOUNTER — Other Ambulatory Visit: Payer: Self-pay

## 2017-03-26 ENCOUNTER — Ambulatory Visit
Admission: RE | Admit: 2017-03-26 | Discharge: 2017-03-26 | Disposition: A | Discharge status: Home / Self Care | Payer: 59 | Source: Ambulatory Visit | Attending: Obstetrics and Gynecology | Admitting: Obstetrics and Gynecology

## 2017-03-26 ENCOUNTER — Other Ambulatory Visit: Payer: Self-pay | Admitting: Obstetrics and Gynecology

## 2017-03-26 ENCOUNTER — Encounter: Payer: Self-pay | Admitting: Obstetrics and Gynecology

## 2017-03-26 VITALS — BP 142/80 | HR 84 | Temp 98.7°F | Resp 16 | Wt 194.0 lb

## 2017-03-26 DIAGNOSIS — N611 Abscess of the breast and nipple: Secondary | ICD-10-CM

## 2017-03-26 DIAGNOSIS — N912 Amenorrhea, unspecified: Secondary | ICD-10-CM

## 2017-03-26 MED ORDER — OXYCODONE-ACETAMINOPHEN 5-325 MG PO TABS: 1.0000 | ORAL_TABLET | ORAL | 0 refills | Status: DC | PRN

## 2017-03-26 MED ORDER — MEDROXYPROGESTERONE ACETATE 5 MG PO TABS: ORAL_TABLET | ORAL | 1 refills | Status: DC

## 2017-03-26 MED ORDER — SULFAMETHOXAZOLE-TRIMETHOPRIM 800-160 MG PO TABS: 1.0000 | ORAL_TABLET | Freq: Two times a day (BID) | ORAL | 0 refills | Status: DC

## 2017-03-26 MED ORDER — IBUPROFEN 800 MG PO TABS: 800.0000 mg | ORAL_TABLET | Freq: Three times a day (TID) | ORAL | 1 refills | Status: AC | PRN

## 2017-03-26 NOTE — Progress Notes (Signed)
GYNECOLOGY  VISIT   HPI: 27 y.o.   Single  Caucasian  female   G0P0000 with Patient's last menstrual period was 02/12/2017.   here c/o left breast pain and redness.   The patient was seen in 12/18 with a tender breast lump under the right nipple, she had an ultrasound that showed a 1.7 cm complicated cyst vs abscess. She was treated with antibiotics. F/U imaging was negative.  She noticed a tender lump in her left breast 10 days ago, it has gotten bigger and very red. She was started on doxycycline on Friday, the mass is still growing.  No fevers, but is on tylenol. In a lot of pain, between a 6-8/10 in severity. Feels just like the abscess on the other side.  She had a tonsillectomy in 12/18.  She hasn't had her cycle since 02/11/18, very light cycle. She isn't sexually active (ever). No galactorrhea. Some hair loss, some fatigue (may be from depression), some weight gain.  No hot flashes.   GYNECOLOGIC HISTORY: Patient's last menstrual period was 02/12/2017. Contraception:IUD Menopausal hormone therapy: none         OB History    Gravida Para Term Preterm AB Living   0 0 0 0 0 0   SAB TAB Ectopic Multiple Live Births   0 0 0 0 0         Patient Active Problem List   Diagnosis Date Noted  . Recurrent tonsillitis 06/11/2016  . Eczema 09/14/2015  . Depression with anxiety 03/01/2014  . ANKLE PAIN, LEFT 07/20/2009  . ROTATOR CUFF INJURY, LEFT SHOULDER 07/20/2009  . OTHER ACQUIRED DEFORMITY OF ANKLE AND FOOT OTHER 07/20/2009  . AMENORRHEA, PRIMARY 09/10/2008  . CERVICAL LYMPHADENOPATHY 09/10/2008    Past Medical History:  Diagnosis Date  . Anemia   . Anxiety   . Depression   . IBS (irritable bowel syndrome)     Past Surgical History:  Procedure Laterality Date  . INTRAUTERINE DEVICE (IUD) INSERTION  07/05/15   Skyla     Current Outpatient Medications  Medication Sig Dispense Refill  . acetaminophen (TYLENOL) 500 MG tablet Take 500 mg by mouth every 6 (six) hours as  needed.    . doxycycline (VIBRAMYCIN) 100 MG capsule Take 1 capsule (100 mg total) by mouth 2 (two) times daily for 10 days. 20 capsule 0  . FLUoxetine (PROZAC) 20 MG tablet TAKE 1 TABLET BY MOUTH ONCE DAILY 90 tablet 3  . Levonorgestrel (SKYLA) 13.5 MG IUD by Intrauterine route. Inserted 07/05/15    . LORazepam (ATIVAN) 0.5 MG tablet Take 1 tablet (0.5 mg total) by mouth every 8 (eight) hours as needed for anxiety. 90 tablet 2  . Multiple Vitamins-Minerals (MULTIVITAMIN PO) Take by mouth daily.     No current facility-administered medications for this visit.      ALLERGIES: Benzoyl peroxide  Family History  Problem Relation Age of Onset  . Anemia Mother   . Hypertension Mother   . Diabetes Mellitus II Paternal Grandmother [phone]  . Breast cancer Paternal Grandmother 1386       lumpectomy and 2 months of chemo  . Heart failure Maternal Grandfather   . Heart attack Maternal Grandfather   . Stroke Maternal Grandmother   . Diabetes Paternal Grandfather   . Depression Unknown   . Melanoma Unknown        PGF  . Hypertension Unknown        Maternal Side   . Stroke Maternal Aunt   .  Heart attack Maternal Aunt     Social History   Socioeconomic History  . Marital status: Single    Spouse name: Not on file  . Number of children: Not on file  . Years of education: Not on file  . Highest education level: Not on file  Social Needs  . Financial resource strain: Not on file  . Food insecurity - worry: Not on file  . Food insecurity - inability: Not on file  . Transportation needs - medical: Not on file  . Transportation needs - non-medical: Not on file  Occupational History  . Not on file  Tobacco Use  . Smoking status: Never Smoker  . Smokeless tobacco: Never Used  Substance and Sexual Activity  . Alcohol use: Yes    Alcohol/week: 0.6 - 2.4 oz    Types: 1 - 4 Standard drinks or equivalent per week    Comment: occ, rare  . Drug use: No  . Sexual activity: Not Currently     Partners: Male    Birth control/protection: IUD    Comment: Insertion 07/05/15  Other Topics Concern  . Not on file  Social History Narrative  . Not on file    Review of Systems  Constitutional: Negative.   HENT: Negative.   Eyes: Negative.   Respiratory: Negative.   Cardiovascular: Negative.   Gastrointestinal: Positive for nausea and vomiting.  Genitourinary:       Left breast pain and redness  Musculoskeletal: Negative.   Skin: Negative.   Neurological: Negative.   Endo/Heme/Allergies: Negative.   Psychiatric/Behavioral: Negative.     PHYSICAL EXAMINATION:    BP (!) 142/80 (BP Location: Right Arm, Patient Position: Sitting, Cuff Size: Normal)   Pulse 84   Temp 98.7 F (37.1 C)   Resp 16   Wt 194 lb (88 kg)   LMP 02/12/2017   BMI 29.72 kg/m     General appearance: alert, cooperative and appears stated age Breasts: 9 x 10 cm very tender mass under her left areolar region, some surrounding discoloration (looks like bruising). Normal right breast.  No significant axillary or supraclavicular adneopathy ASSESSMENT 9 x 10 cm left breast mass, most c/w abscess. Enlarging on antibiotics Amenorrhea, no chance of pregnancy    PLAN Needs to be seen today for imaging and likely drainage Will stop doxycycline, start bactrim When she is feeling better will take provera, call with or without a cycle CBC with diff TSH, prolactin   An After Visit Summary was printed and given to the patient.

## 2017-03-27 LAB — CBC WITH DIFFERENTIAL/PLATELET
BASOS: 0 %
Basophils Absolute: 0 10*3/uL (ref 0.0–0.2)
EOS (ABSOLUTE): 0.1 10*3/uL (ref 0.0–0.4)
Eos: 1 %
Hematocrit: 39.1 % (ref 34.0–46.6)
Hemoglobin: 12.9 g/dL (ref 11.1–15.9)
IMMATURE GRANULOCYTES: 0 %
Immature Grans (Abs): 0 10*3/uL (ref 0.0–0.1)
Lymphocytes Absolute: 1.8 10*3/uL (ref 0.7–3.1)
Lymphs: 15 %
MCH: 27.3 pg (ref 26.6–33.0)
MCHC: 33 g/dL (ref 31.5–35.7)
MCV: 83 fL (ref 79–97)
MONOS ABS: 0.7 10*3/uL (ref 0.1–0.9)
Monocytes: 6 %
NEUTROS PCT: 78 %
Neutrophils Absolute: 9.8 10*3/uL — ABNORMAL HIGH (ref 1.4–7.0)
PLATELETS: 320 10*3/uL (ref 150–379)
RBC: 4.73 x10E6/uL (ref 3.77–5.28)
RDW: 14 % (ref 12.3–15.4)
WBC: 12.5 10*3/uL — AB (ref 3.4–10.8)

## 2017-03-27 LAB — TSH: TSH: 0.923 u[IU]/mL (ref 0.450–4.500)

## 2017-03-27 LAB — PROLACTIN: Prolactin: 27.8 ng/mL — ABNORMAL HIGH (ref 4.8–23.3)

## 2017-04-01 ENCOUNTER — Ambulatory Visit
Admission: RE | Admit: 2017-04-01 | Discharge: 2017-04-01 | Disposition: A | Discharge status: Home / Self Care | Payer: 59 | Source: Ambulatory Visit | Attending: Obstetrics and Gynecology | Admitting: Obstetrics and Gynecology

## 2017-04-01 ENCOUNTER — Telehealth: Payer: Self-pay

## 2017-04-01 DIAGNOSIS — N611 Abscess of the breast and nipple: Secondary | ICD-10-CM

## 2017-04-01 DIAGNOSIS — N6489 Other specified disorders of breast: Secondary | ICD-10-CM | POA: Diagnosis not present

## 2017-04-01 LAB — AEROBIC/ANAEROBIC CULTURE W GRAM STAIN (SURGICAL/DEEP WOUND)

## 2017-04-01 LAB — AEROBIC/ANAEROBIC CULTURE (SURGICAL/DEEP WOUND)

## 2017-04-01 MED ORDER — PENICILLIN V POTASSIUM 500 MG PO TABS: 500.0000 mg | ORAL_TABLET | Freq: Three times a day (TID) | ORAL | 0 refills | Status: DC

## 2017-04-01 NOTE — Telephone Encounter (Signed)
Spoke with Verlon AuLeslie in microbiology for the hospital who states that prevotella is anaerobic and sensitives are not commonly run on this. Was tested for beta lactamase which was negative. Can use antibiotic for anaerobic bacteria or beta lactam medications such as penicillin. Per review with Dr.Silva patient will need to switch to taking PCN V 500 mg po TID x 7 days.  Spoke with patient. Advised of message as seen above. Patient is agreeable. Rx for PCN V 500 mg po TID x 1 week sent to pharmacy on file. Patient states that she is feeling well today. Did not feel well this weekend. Wants to move follow up to Friday to give new medication time to work. Feels okay doing this. Appointment moved to 04/05/2017 at 10:30 am. Patient is agreeable.  Cc: Dr.Jertson

## 2017-04-01 NOTE — Telephone Encounter (Signed)
-----   Message from Patton SallesBrook E Amundson C Silva, MD sent at 04/01/2017  3:07 PM EST ----- Please contact microbiology regarding the final culture showing strep viridans and prevotella melanningenica. The strep viridans needs to be treated with PCN or Levofloxacin.  I need to know if the presence of the prevotella changes the selection of abx.

## 2017-04-03 ENCOUNTER — Emergency Department (HOSPITAL_COMMUNITY)
Admission: EM | Admit: 2017-04-03 | Discharge: 2017-04-03 | Disposition: A | Discharge status: Home / Self Care | Payer: 59 | Attending: Emergency Medicine | Admitting: Emergency Medicine

## 2017-04-03 ENCOUNTER — Ambulatory Visit: Payer: 59 | Admitting: Obstetrics and Gynecology

## 2017-04-03 ENCOUNTER — Encounter (HOSPITAL_COMMUNITY): Payer: Self-pay | Admitting: Emergency Medicine

## 2017-04-03 ENCOUNTER — Other Ambulatory Visit: Payer: Self-pay

## 2017-04-03 ENCOUNTER — Telehealth: Payer: Self-pay | Admitting: Obstetrics and Gynecology

## 2017-04-03 DIAGNOSIS — N644 Mastodynia: Secondary | ICD-10-CM | POA: Diagnosis present

## 2017-04-03 DIAGNOSIS — N611 Abscess of the breast and nipple: Secondary | ICD-10-CM | POA: Insufficient documentation

## 2017-04-03 DIAGNOSIS — Z79899 Other long term (current) drug therapy: Secondary | ICD-10-CM | POA: Diagnosis not present

## 2017-04-03 MED ORDER — FENTANYL CITRATE (PF) 100 MCG/2ML IJ SOLN
50.0000 ug | Freq: Once | INTRAMUSCULAR | Status: AC
  Administered 2017-04-03: 50 ug via INTRAVENOUS
  Filled 2017-04-03: qty 2

## 2017-04-03 MED ORDER — OXYCODONE-ACETAMINOPHEN 5-325 MG PO TABS: 1.0000 | ORAL_TABLET | Freq: Three times a day (TID) | ORAL | 0 refills | Status: DC | PRN

## 2017-04-03 MED ORDER — FENTANYL CITRATE (PF) 100 MCG/2ML IJ SOLN
100.0000 ug | Freq: Once | INTRAMUSCULAR | Status: AC
Start: 2017-04-03 — End: 2017-04-03
  Administered 2017-04-03: 100 ug via INTRAVENOUS
  Filled 2017-04-03: qty 2

## 2017-04-03 MED ORDER — SODIUM CHLORIDE 0.9 % IV BOLUS (SEPSIS)
1000.0000 mL | Freq: Once | INTRAVENOUS | Status: AC
  Administered 2017-04-03: 1000 mL via INTRAVENOUS

## 2017-04-03 NOTE — ED Triage Notes (Signed)
Pt reports she woke up in such pain that "my vision was blury".  She has being treated for an infection in her left breast however the pain is "unbearable" at this time.  Denies fever, N/V/D, dizziness, SOB.

## 2017-04-03 NOTE — Telephone Encounter (Signed)
Patient was seen in office on 03/26/2017 with Dr.Jertson. Please see OV note. Encounter closed.

## 2017-04-03 NOTE — Telephone Encounter (Signed)
Dr.Jertson, okay to close encounter? 

## 2017-04-03 NOTE — ED Provider Notes (Signed)
[facility] EMERGENCY DEPARTMENT Provider Note   CSN: [id number] Arrival date & time: 04/03/17  0202     History   Chief Complaint Chief Complaint  Patient presents with  . Breast Pain    HPI Di Severt is a 27 y.o. female.  The history is provided by the patient.  Illness  This is a recurrent problem. Episode onset: just prior to arrival. The problem occurs constantly. The problem has been rapidly worsening. Pertinent negatives include no abdominal pain and no shortness of breath. Exacerbated by: movement/palpation. The symptoms are relieved by rest.  Reports recent left breast abscess that required ultrasound-guided drainage.  She reports she has had 2 in the past month.  She is now on another round of antibiotics.  She is scheduled to see general surgery and her OB/GYN this week.  Tonight while she was sleeping she had worsening pain in the left breast.  She denies any fevers or vomiting.  She reports the pain was so severe she reported her vision was blurry.  Her vision is now back to baseline. She reports that the appearance of the wound is actually improved.  However she has had increasing pain since the most recent ultrasound-guided drainage  Past Medical History:  Diagnosis Date  . Anemia   . Anxiety   . Depression   . IBS (irritable bowel syndrome)     Patient Active Problem List   Diagnosis Date Noted  . Recurrent tonsillitis 06/11/2016  . Eczema 09/14/2015  . Depression with anxiety 03/01/2014  . ANKLE PAIN, LEFT 07/20/2009  . ROTATOR CUFF INJURY, LEFT SHOULDER 07/20/2009  . OTHER ACQUIRED DEFORMITY OF ANKLE AND FOOT OTHER 07/20/2009  . AMENORRHEA, PRIMARY 09/10/2008  . CERVICAL LYMPHADENOPATHY 09/10/2008    Past Surgical History:  Procedure Laterality Date  . INTRAUTERINE DEVICE (IUD) INSERTION  07/05/15   Skyla     OB History    Gravida Para Term Preterm AB Living   0 0 0 0 0 0   SAB TAB Ectopic Multiple Live Births   0 0 0 0 0        Home Medications    Prior to Admission medications   Medication Sig Start Date End Date Taking? Authorizing Provider  acetaminophen (TYLENOL) 500 MG tablet Take 500 mg by mouth every 6 (six) hours as needed.    [provider]  FLUoxetine (PROZAC) 20 MG tablet TAKE 1 TABLET BY MOUTH ONCE DAILY 08/28/16   Nelwyn Salisbury, MD  ibuprofen (ADVIL,MOTRIN) 800 MG tablet Take 1 tablet (800 mg total) by mouth every 8 (eight) hours as needed. 03/26/17   Romualdo Bolk, MD  Levonorgestrel (SKYLA) 13.5 MG IUD by Intrauterine route. Inserted 07/05/15    [provider]  LORazepam (ATIVAN) 0.5 MG tablet Take 1 tablet (0.5 mg total) by mouth every 8 (eight) hours as needed for anxiety. 03/22/17   Nelwyn Salisbury, MD  medroxyPROGESTERone (PROVERA) 5 MG tablet Take one tablet a day for 5 days. 03/26/17   Romualdo Bolk, MD  Multiple Vitamins-Minerals (MULTIVITAMIN PO) Take by mouth daily.    [provider]  oxyCODONE-acetaminophen (PERCOCET) 5-325 MG tablet Take 1-2 tablets by mouth every 4 (four) hours as needed. use only as much as needed to relieve pain 03/26/17   Romualdo Bolk, MD  penicillin v potassium (VEETID) 500 MG tablet Take 1 tablet (500 mg total) by mouth 3 (three) times daily. 04/01/17   Patton Salles, MD  sulfamethoxazole-trimethoprim (BACTRIM DS) 800-160 MG tablet Take 1 tablet by mouth 2 (two) times daily. One PO BID x 10 days 03/26/17   Romualdo BolkJertson, Jill Evelyn, MD    Family History Family History  Problem Relation Age of Onset  . Anemia Mother   . Hypertension Mother   . Diabetes Mellitus II Paternal Grandmother [id number]  . Breast cancer Paternal Grandmother [id number]       lumpectomy and 2 months of chemo  . Heart failure Maternal Grandfather   . Heart attack Maternal Grandfather   . Stroke Maternal Grandmother   . Diabetes Paternal Grandfather   . Depression Unknown   . Melanoma Unknown        PGF  . Hypertension Unknown        Maternal  Side   . Stroke Maternal Aunt   . Heart attack Maternal Aunt     Social History Social History   Tobacco Use  . Smoking status: Never Smoker  . Smokeless tobacco: Never Used  Substance Use Topics  . Alcohol use: Yes    Alcohol/week: 0.6 - 2.4 oz    Types: 1 - 4 Standard drinks or equivalent per week    Comment: occ, rare  . Drug use: No     Allergies   Benzoyl peroxide   Review of Systems Review of Systems  Constitutional: Negative for fever.  Respiratory: Negative for shortness of breath.   Gastrointestinal: Negative for abdominal pain and vomiting.  Skin: Positive for color change and wound.  All other systems reviewed and are negative.    Physical Exam Updated Vital Signs BP (!) 144/94   Pulse 93   Temp 99.6 F (37.6 C)   Resp 18   Ht 1.727 m (5\' 8" )   Wt 87.1 kg (192 lb)   SpO2 96%   BMI 29.19 kg/m   Physical Exam CONSTITUTIONAL: Well developed/well nourished HEAD: Normocephalic/atraumatic EYES: EOMI ENMT: Mucous membranes moist NECK: supple no meningeal signs SPINE/BACK:entire spine nontender CV: S1/S2 noted, no murmurs/rubs/gallops noted LUNGS: Lungs are clear to auscultation bilaterally, no apparent distress Left Breast -erythema and tenderness noted to lower portion of left breast.  No drainage.  No crepitus.  Female nurse chaperone present for entire exam ABDOMEN: soft, nontender NEURO: Pt is awake/alert/appropriate, moves all extremitiesx4.  No facial droop.   EXTREMITIES: pulses normal/equal, full ROM SKIN: warm PSYCH: no abnormalities of mood noted, alert and oriented to situation   ED Treatments / Results  Labs (all labs ordered are listed, but only abnormal results are displayed) Labs Reviewed - No data to display  EKG  EKG Interpretation None       Radiology [facility]  Result Date: 04/01/2017 CLINICAL DATA:  Followup left breast abscess aspirated 1 week ago. She has 4 days left of Bactrim. She  reports a significant increase in symptoms with severe pain, increased size of an associated area of skin redness and fullness in the breast. The culture and sensitivity for the aspirated pus on 03/26/2017 demonstrated abundant white blood cells with abundant Viridans Streptococcus sensitive to all antibiotics tested with the exception of erythromycin and abundant Prevotella Melaninogenica. She has a surgical consultation with Dr. Magnus IvanBlackman at 1:50 p.m. on 04/04/2017. EXAM: ULTRASOUND OF THE LEFT BREAST COMPARISON:  Previous exam(s). FINDINGS: On physical exam, the patient has an approximately 8 cm rounded area of skin redness and an associated firm, palpable, markedly tender mass centered in the retroareolar left breast. Targeted ultrasound is performed, showing  an approximately 7.5 x 6.8 x 6.2 cm rounded, echogenic area in the retroareolar left breast containing an approximately 4.7 x 3.9 x 3.2 cm irregular, heterogeneous, mixed hypoechoic and echogenic area. IMPRESSION: 7.5 x 6.8 x 6.2 cm left retroareolar phlegmon containing an approximately 4.7 x 3.9 x 3.2 cm abscess in the retroareolar left breast. RECOMMENDATION: Ultrasound-guided needle aspiration of the 4.7 cm retroareolar abscess. This has been discussed with the patient and scheduled to follow. I have discussed the findings and recommendations with the patient. Results were also provided in writing at the conclusion of the visit. If applicable, a reminder letter will be sent to the patient regarding the next appointment. BI-RADS CATEGORY  2: Benign. Electronically Signed   By: Beckie Salts M.D.   On: 04/01/2017 13:02   US Breast Aspiration Left  Result Date: 04/01/2017 CLINICAL DATA:  4.7 cm retroareolar left breast abscess at ultrasound earlier today. This was aspirated on 03/26/2017 with culture and sensitivity positive for abundant white blood cells with abundant Viridans Streptococcus sensitive to all antibiotics tested with the exception of  erythromycin. There was also abundant Prevotella Melaninogenica. She has a surgical consultation with Dr. Magnus Ivan at 1:50 p.m. on 04/04/2017. She has four days of Bactrim left to take. EXAM: ULTRASOUND GUIDED LEFT BREAST ABSCESS ASPIRATION COMPARISON:  Previous exams. PROCEDURE: Using sterile technique, 1% lidocaine, under direct ultrasound visualization, needle aspiration of the 4.7 cm retroareolar abscess seen in the left breast at ultrasound earlier today was performed. This yielded 50 cc of foul-smelling pus. This was tan and pinkish in color. IMPRESSION: Ultrasound-guided aspiration of a 4.7 cm retroareolar left breast abscess no apparent complications. RECOMMENDATIONS: Follow-up with Dr. Magnus Ivan as scheduled. Electronically Signed   By: Beckie Salts M.D.   On: 04/01/2017 14:22    Procedures Procedures   Medications Ordered in ED Medications  sodium chloride 0.9 % bolus 1,000 mL (0 mLs Intravenous Stopped 04/03/17 0628)  fentaNYL (SUBLIMAZE) injection 50 mcg (50 mcg Intravenous Given 04/03/17 0529)  fentaNYL (SUBLIMAZE) injection 100 mcg (100 mcg Intravenous Given 04/03/17 0631)   Narcotic database reviewed and considered in decision making  Initial Impression / Assessment and Plan / ED Course  I have reviewed the triage vital signs and the nursing notes.      Began having worsening left breast pain tonight while sleeping.  She had recent ultrasound-guided  Drainage. She was recently just changed to penicillin due to positive cultures.  She reports the appearance of the wound is actually improved.  There is no fevers.  She is nontoxic in appearance she has follow-up this week. Plan to treat pain, discharge home with close follow-up 6:56 AM Patient improved.  Vitals appropriate. I feel she is appropriate for discharge. Percocet that were given earlier this month has run out.  Will give short course to bridge her until she sees her OB/GYN in 48 hours we discussed strict return  precautions Final Clinical Impressions(s) / ED Diagnoses   Final diagnoses:  Breast abscess    ED Discharge Orders        Ordered    oxyCODONE-acetaminophen (PERCOCET) 5-325 MG tablet  Every 8 hours PRN     04/03/17 0650       Zadie Rhine, MD 04/03/17 [phone]

## 2017-04-03 NOTE — Telephone Encounter (Signed)
Spoke with the patient, was seen in the ER overnight with pain in her breast and chest wall. She is having pain in her ribs and chest wall, thinks she has been holding herself in a strange position to protect breast. She is almost out of Percocet, using ibuprofen and on PCN (since yesterday at noon).  She had another 50 cc of pus drained from her breast on Monday.  The ER doctor gave her 5 percocet tablets. She is trying not to take it. She will call tomorrow if she needs more pain medication.

## 2017-04-04 ENCOUNTER — Other Ambulatory Visit: Payer: Self-pay | Admitting: Surgery

## 2017-04-04 ENCOUNTER — Other Ambulatory Visit: Payer: Self-pay

## 2017-04-04 ENCOUNTER — Inpatient Hospital Stay (HOSPITAL_COMMUNITY)
Admission: AD | Admit: 2017-04-04 | Discharge: 2017-04-09 | DRG: 601 | Disposition: A | Discharge status: Home / Self Care | Payer: 59 | Source: Ambulatory Visit | Attending: Surgery | Admitting: Surgery

## 2017-04-04 ENCOUNTER — Encounter (HOSPITAL_COMMUNITY): Payer: Self-pay

## 2017-04-04 ENCOUNTER — Ambulatory Visit: Payer: 59 | Admitting: Psychology

## 2017-04-04 DIAGNOSIS — N611 Abscess of the breast and nipple: Secondary | ICD-10-CM | POA: Diagnosis not present

## 2017-04-04 DIAGNOSIS — F329 Major depressive disorder, single episode, unspecified: Secondary | ICD-10-CM | POA: Diagnosis present

## 2017-04-04 DIAGNOSIS — Z888 Allergy status to other drugs, medicaments and biological substances status: Secondary | ICD-10-CM

## 2017-04-04 DIAGNOSIS — F419 Anxiety disorder, unspecified: Secondary | ICD-10-CM | POA: Diagnosis present

## 2017-04-04 DIAGNOSIS — Z79899 Other long term (current) drug therapy: Secondary | ICD-10-CM

## 2017-04-04 LAB — BASIC METABOLIC PANEL
ANION GAP: 12 (ref 5–15)
BUN: 9 mg/dL (ref 6–20)
CHLORIDE: 102 mmol/L (ref 101–111)
CO2: 24 mmol/L (ref 22–32)
Calcium: 9 mg/dL (ref 8.9–10.3)
Creatinine, Ser: 0.54 mg/dL (ref 0.44–1.00)
GFR calc Af Amer: >60 mL/min (ref >60)
GLUCOSE: 123 mg/dL — AB (ref 65–99)
POTASSIUM: 3.6 mmol/L (ref 3.5–5.1)
Sodium: 138 mmol/L (ref 135–145)

## 2017-04-04 LAB — SURGICAL PCR SCREEN
MRSA, PCR: NEGATIVE
Staphylococcus aureus: NEGATIVE

## 2017-04-04 LAB — CBC
HEMATOCRIT: 36.5 % (ref 36.0–46.0)
HEMOGLOBIN: 12.2 g/dL (ref 12.0–15.0)
MCH: 27.6 pg (ref 26.0–34.0)
MCHC: 33.4 g/dL (ref 30.0–36.0)
MCV: 82.6 fL (ref 78.0–100.0)
Platelets: 373 10*3/uL (ref 150–400)
RBC: 4.42 MIL/uL (ref 3.87–5.11)
RDW: 13.1 % (ref 11.5–15.5)
WBC: 17.5 10*3/uL — ABNORMAL HIGH (ref 4.0–10.5)

## 2017-04-04 MED ORDER — ONDANSETRON HCL 4 MG/2ML IJ SOLN: 4.0000 mg | Freq: Four times a day (QID) | INTRAMUSCULAR | Status: DC | PRN

## 2017-04-04 MED ORDER — ONDANSETRON 4 MG PO TBDP: 4.0000 mg | ORAL_TABLET | Freq: Four times a day (QID) | ORAL | Status: DC | PRN

## 2017-04-04 MED ORDER — PIPERACILLIN-TAZOBACTAM 3.375 G IVPB
3.3750 g | Freq: Three times a day (TID) | INTRAVENOUS | Status: DC
  Administered 2017-04-04 – 2017-04-08 (×11): 3.375 g via INTRAVENOUS
  Filled 2017-04-04 (×11): qty 50

## 2017-04-04 MED ORDER — ENOXAPARIN SODIUM 40 MG/0.4ML ~~LOC~~ SOLN
40.0000 mg | SUBCUTANEOUS | Status: DC
  Administered 2017-04-04 – 2017-04-08 (×5): 40 mg via SUBCUTANEOUS
  Filled 2017-04-04 (×5): qty 0.4

## 2017-04-04 MED ORDER — OXYCODONE HCL 5 MG PO TABS
5.0000 mg | ORAL_TABLET | ORAL | Status: DC | PRN
  Administered 2017-04-04: 23:00:00 5 mg via ORAL
  Administered 2017-04-05 – 2017-04-08 (×3): 10 mg via ORAL
  Filled 2017-04-04 (×3): qty 2
  Filled 2017-04-04: qty 1
  Filled 2017-04-04: qty 2

## 2017-04-04 MED ORDER — POTASSIUM CHLORIDE IN NACL 20-0.9 MEQ/L-% IV SOLN
INTRAVENOUS | Status: DC
  Administered 2017-04-04: 18:00:00 via INTRAVENOUS
  Filled 2017-04-04 (×2): qty 1000

## 2017-04-04 MED ORDER — HYDROMORPHONE HCL 1 MG/ML IJ SOLN
1.0000 mg | INTRAMUSCULAR | Status: DC | PRN
  Administered 2017-04-05 – 2017-04-07 (×4): 1 mg via INTRAVENOUS
  Filled 2017-04-04 (×4): qty 1

## 2017-04-04 NOTE — H&P (Signed)
April Rivas Documented: 04/04/2017 2:30 PM Location: [facility] Patient #: [id number] DOB: 01/25/1991 Single / Language: [place] / Race: White Female   History of Present Illness (Treasure Ingrum A. Magnus IvanBlackman MD; 04/04/2017 2:49 PM) The patient is a 27 year old female who presents with a breast abscess. This patient was referred by the breast center for evaluation of a left breast abscess. She actually had a right breast abscess aspirated several times in November and December and it quickly resolved. Approximately 3 weeks ago, she developed an abscess in the left breast. She has had 2 aspirations of this breast with the last being 3 days ago where 50 cc of purulent fluid was aspirated. She now has worsening pain and cellulitis of the left breast. She is otherwise without complaints. She has currently been on Bactrim.   Past Surgical History Doristine Devoid(Chemira Jones, CMA; 04/04/2017 2:31 PM) Tonsillectomy   Diagnostic Studies History Doristine Devoid(Chemira Jones, CMA; 04/04/2017 2:31 PM) Colonoscopy  never Mammogram  never Pap Smear  1-5 years ago  Allergies Doristine Devoid(Chemira Jones, CMA; 04/04/2017 2:33 PM) Benzoyl Peroxide *DERMATOLOGICALS*   Medication History Doristine Devoid(Chemira Jones, CMA; 04/04/2017 2:33 PM) Oxycodone-Acetaminophen (5-325MG  Tablet, Oral) Active. Temazepam (15MG  Capsule, Oral) Active. FLUoxetine HCl (20MG  Capsule, Oral) Active. Penicillin V Potassium (500MG  Tablet, Oral) Active. Medications Reconciled  Social History Doristine Devoid(Chemira Jones, CMA; 04/04/2017 2:31 PM) Alcohol use  Occasional alcohol use. Caffeine use  Carbonated beverages, Coffee, Tea. No drug use  Tobacco use  Never smoker.  Family History Doristine Devoid(Chemira Jones, CMA; 04/04/2017 2:31 PM) Depression  Mother. Hypertension  Mother.  Pregnancy / Birth History Doristine Devoid(Chemira Jones, CMA; 04/04/2017 2:31 PM) Age at menarche  18 years. Contraceptive History  Intrauterine device. Gravida  0 Regular periods   Other Problems Doristine Devoid(Chemira  Jones, CMA; 04/04/2017 2:31 PM) Anxiety Disorder  Depression     Review of Systems Doristine Devoid(Chemira Jones CMA; 04/04/2017 2:31 PM) General Present- Fatigue and Fever. Not Present- Appetite Loss, Chills, Night Sweats, Weight Gain and Weight Loss. Skin Present- Dryness and Non-Healing Wounds. Not Present- Change in Wart/Mole, Hives, Jaundice, New Lesions, Rash and Ulcer. HEENT Not Present- Earache, Hearing Loss, Hoarseness, Nose Bleed, Oral Ulcers, Ringing in the Ears, Seasonal Allergies, Sinus Pain, Sore Throat, Visual Disturbances, Wears glasses/contact lenses and Yellow Eyes. Respiratory Not Present- Bloody sputum, Chronic Cough, Difficulty Breathing, Snoring and Wheezing. Breast Present- Breast Mass, Breast Pain and Skin Changes. Not Present- Nipple Discharge. Cardiovascular Not Present- Chest Pain, Difficulty Breathing Lying Down, Leg Cramps, Palpitations, Rapid Heart Rate, Shortness of Breath and Swelling of Extremities. Gastrointestinal Not Present- Abdominal Pain, Bloating, Bloody Stool, Change in Bowel Habits, Chronic diarrhea, Constipation, Difficulty Swallowing, Excessive gas, Gets full quickly at meals, Hemorrhoids, Indigestion, Nausea, Rectal Pain and Vomiting. Female Genitourinary Not Present- Frequency, Nocturia, Painful Urination, Pelvic Pain and Urgency. Musculoskeletal Present- Back Pain, Joint Stiffness and Muscle Pain. Not Present- Joint Pain, Muscle Weakness and Swelling of Extremities. Neurological Not Present- Decreased Memory, Fainting, Headaches, Numbness, Seizures, Tingling, Tremor, Trouble walking and Weakness. Psychiatric Not Present- Anxiety, Bipolar, Change in Sleep Pattern, Depression, Fearful and Frequent crying. Endocrine Present- Hair Changes. Not Present- Cold Intolerance, Excessive Hunger, Heat Intolerance, Hot flashes and New Diabetes. Hematology Present- Persistent Infections. Not Present- Blood Thinners, Easy Bruising, Excessive bleeding, Gland problems and  HIV.  Vitals (Chemira Jones CMA; 04/04/2017 2:31 PM) 04/04/2017 2:31 PM Weight: 194 lb Height: 68in Body Surface Area: 2.02 m Body Mass Index: 29.5 kg/m  Temp.: 100F(Oral)  Pulse: 107 (Regular)  BP: 130/86 (Sitting, Left Arm, Standard)  Physical Exam (Adan Baehr A. Magnus Ivan MD; 04/04/2017 2:50 PM) General Mental Status-Alert. General Appearance-Consistent with stated age. Hydration-Well hydrated. Voice-Normal.  Head and Neck Head-normocephalic, atraumatic with no lesions or palpable masses. Trachea-midline. Thyroid Gland Characteristics - normal size and consistency.  Eye Eyeball - Bilateral-Extraocular movements intact. Sclera/Conjunctiva - Bilateral-No scleral icterus.  Chest and Lung Exam Chest and lung exam reveals -quiet, even and easy respiratory effort with no use of accessory muscles and on auscultation, normal breath sounds, no adventitious sounds and normal vocal resonance. Inspection Chest Wall - Normal. Back - normal.  Breast Breast - Left-Symmetric, Tender, Erythematous and Inflamed, No Biopsy scars, No Dimpling, No Lumpectomy scars, No Mastectomy scars, No Peau d' Orange. Breast - Right-Symmetric, Non Tender, No Biopsy scars, no Dimpling, No Inflammation, No Lumpectomy scars, No Mastectomy scars, No Peau d' Orange. Breast Lump-No Palpable Breast Mass. Note: There is extensive cellulitis around the areola and a palpable underlying abscess   Cardiovascular Cardiovascular examination reveals -normal heart sounds, regular rate and rhythm with no murmurs and normal pedal pulses bilaterally.  Abdomen Inspection Inspection of the abdomen reveals - No Hernias. Skin - Scar - no surgical scars. Palpation/Percussion Palpation and Percussion of the abdomen reveal - Soft, Non Tender, No Rebound tenderness, No Rigidity (guarding) and No hepatosplenomegaly. Auscultation Auscultation of the abdomen reveals - Bowel sounds  normal.  Neurologic - Did not examine.  Musculoskeletal - Did not examine.  Lymphatic Head & Neck  General Head & Neck Lymphatics: Bilateral - Description - Normal. Axillary  General Axillary Region: Bilateral - Description - Normal. Tenderness - Non Tender. Femoral & Inguinal - Did not examine.    Assessment & Plan (Kirti Carl A. Magnus Ivan MD; 04/04/2017 2:51 PM) LEFT BREAST ABSCESS (N61.1) Impression: This is clearly not improving with aspiration. Given her discomfort and the amount of cellulitis and induration, I believe she needs admission to the hospital for IV antibiotics and incision and drainage of the left breast abscess in the operating room. I have discussed the reasons for this with her. We will admit her to [facility] to our acute care service. I have notified Dr. Earnest Bailey of the admission

## 2017-04-05 ENCOUNTER — Encounter (HOSPITAL_COMMUNITY): Payer: Self-pay

## 2017-04-05 ENCOUNTER — Observation Stay (HOSPITAL_COMMUNITY): Payer: 59 | Admitting: Anesthesiology

## 2017-04-05 ENCOUNTER — Telehealth: Payer: Self-pay | Admitting: Obstetrics and Gynecology

## 2017-04-05 ENCOUNTER — Encounter (HOSPITAL_COMMUNITY): Admission: AD | Disposition: A | Discharge status: Home / Self Care | Payer: Self-pay | Source: Ambulatory Visit

## 2017-04-05 ENCOUNTER — Ambulatory Visit: Payer: 59 | Admitting: Obstetrics and Gynecology

## 2017-04-05 DIAGNOSIS — F419 Anxiety disorder, unspecified: Secondary | ICD-10-CM | POA: Diagnosis present

## 2017-04-05 DIAGNOSIS — F329 Major depressive disorder, single episode, unspecified: Secondary | ICD-10-CM | POA: Diagnosis present

## 2017-04-05 DIAGNOSIS — J0391 Acute recurrent tonsillitis, unspecified: Secondary | ICD-10-CM | POA: Diagnosis not present

## 2017-04-05 DIAGNOSIS — Z888 Allergy status to other drugs, medicaments and biological substances status: Secondary | ICD-10-CM | POA: Diagnosis not present

## 2017-04-05 DIAGNOSIS — N611 Abscess of the breast and nipple: Secondary | ICD-10-CM | POA: Diagnosis not present

## 2017-04-05 DIAGNOSIS — Z79899 Other long term (current) drug therapy: Secondary | ICD-10-CM | POA: Diagnosis not present

## 2017-04-05 HISTORY — PX: EVACUATION BREAST HEMATOMA: SHX1537

## 2017-04-05 LAB — PREGNANCY, URINE: Preg Test, Ur: NEGATIVE

## 2017-04-05 SURGERY — EVACUATION, HEMATOMA, BREAST
Anesthesia: General | Laterality: Left

## 2017-04-05 MED ORDER — ONDANSETRON HCL 4 MG/2ML IJ SOLN
INTRAMUSCULAR | Status: AC
  Filled 2017-04-05: qty 2

## 2017-04-05 MED ORDER — LIDOCAINE HCL (PF) 1 % IJ SOLN
INTRAMUSCULAR | Status: AC
  Filled 2017-04-05: qty 30

## 2017-04-05 MED ORDER — 0.9 % SODIUM CHLORIDE (POUR BTL) OPTIME
TOPICAL | Status: DC | PRN
  Administered 2017-04-05: 1000 mL

## 2017-04-05 MED ORDER — POTASSIUM CHLORIDE IN NACL 20-0.9 MEQ/L-% IV SOLN
INTRAVENOUS | Status: DC
  Administered 2017-04-05: 17:00:00 via INTRAVENOUS
  Filled 2017-04-05 (×2): qty 1000

## 2017-04-05 MED ORDER — MIDAZOLAM HCL 2 MG/2ML IJ SOLN
INTRAMUSCULAR | Status: AC
  Filled 2017-04-05: qty 2

## 2017-04-05 MED ORDER — LACTATED RINGERS IV SOLN
INTRAVENOUS | Status: DC
  Administered 2017-04-05: 09:00:00 via INTRAVENOUS
  Administered 2017-04-05: 1000 mL via INTRAVENOUS

## 2017-04-05 MED ORDER — PANTOPRAZOLE SODIUM 40 MG PO TBEC
40.0000 mg | DELAYED_RELEASE_TABLET | Freq: Every day | ORAL | Status: DC
  Administered 2017-04-06 – 2017-04-09 (×4): 40 mg via ORAL
  Filled 2017-04-05 (×4): qty 1

## 2017-04-05 MED ORDER — MIDAZOLAM HCL 2 MG/2ML IJ SOLN
0.5000 mg | INTRAMUSCULAR | Status: DC | PRN
  Administered 2017-04-05: 0.5 mg via INTRAVENOUS

## 2017-04-05 MED ORDER — GABAPENTIN 300 MG PO CAPS
300.0000 mg | ORAL_CAPSULE | ORAL | Status: AC
  Administered 2017-04-05: 300 mg via ORAL
  Filled 2017-04-05: qty 1

## 2017-04-05 MED ORDER — PROPOFOL 10 MG/ML IV BOLUS
INTRAVENOUS | Status: DC | PRN
  Administered 2017-04-05: 200 mg via INTRAVENOUS

## 2017-04-05 MED ORDER — FENTANYL CITRATE (PF) 250 MCG/5ML IJ SOLN
INTRAMUSCULAR | Status: AC
  Filled 2017-04-05: qty 5

## 2017-04-05 MED ORDER — HYDROMORPHONE HCL 1 MG/ML IJ SOLN
0.2500 mg | INTRAMUSCULAR | Status: DC | PRN
  Administered 2017-04-05 (×3): 0.5 mg via INTRAVENOUS

## 2017-04-05 MED ORDER — MIDAZOLAM HCL 5 MG/5ML IJ SOLN
INTRAMUSCULAR | Status: DC | PRN
  Administered 2017-04-05: 2 mg via INTRAVENOUS

## 2017-04-05 MED ORDER — DOCUSATE SODIUM 100 MG PO CAPS
100.0000 mg | ORAL_CAPSULE | Freq: Two times a day (BID) | ORAL | Status: DC
  Administered 2017-04-05 – 2017-04-09 (×8): 100 mg via ORAL
  Filled 2017-04-05 (×8): qty 1

## 2017-04-05 MED ORDER — FENTANYL CITRATE (PF) 100 MCG/2ML IJ SOLN
INTRAMUSCULAR | Status: DC | PRN
  Administered 2017-04-05: 100 ug via INTRAVENOUS
  Administered 2017-04-05 (×2): 50 ug via INTRAVENOUS

## 2017-04-05 MED ORDER — LIDOCAINE 2% (20 MG/ML) 5 ML SYRINGE
INTRAMUSCULAR | Status: DC | PRN
  Administered 2017-04-05: 100 mg via INTRAVENOUS

## 2017-04-05 MED ORDER — KETAMINE HCL 10 MG/ML IJ SOLN
INTRAMUSCULAR | Status: DC | PRN
  Administered 2017-04-05 (×2): 20 mg via INTRAVENOUS

## 2017-04-05 MED ORDER — KETAMINE HCL 10 MG/ML IJ SOLN
INTRAMUSCULAR | Status: AC
  Filled 2017-04-05: qty 1

## 2017-04-05 MED ORDER — ACETAMINOPHEN 500 MG PO TABS
1000.0000 mg | ORAL_TABLET | ORAL | Status: AC
  Administered 2017-04-05: 1000 mg via ORAL
  Filled 2017-04-05: qty 2

## 2017-04-05 MED ORDER — BUPIVACAINE-EPINEPHRINE 0.25% -1:200000 IJ SOLN
INTRAMUSCULAR | Status: AC
  Filled 2017-04-05: qty 1

## 2017-04-05 MED ORDER — LORAZEPAM 0.5 MG PO TABS
0.5000 mg | ORAL_TABLET | Freq: Three times a day (TID) | ORAL | Status: DC | PRN
  Administered 2017-04-05: 0.5 mg via ORAL
  Filled 2017-04-05: qty 1

## 2017-04-05 MED ORDER — ONDANSETRON HCL 4 MG/2ML IJ SOLN
INTRAMUSCULAR | Status: DC | PRN
  Administered 2017-04-05: 4 mg via INTRAVENOUS

## 2017-04-05 MED ORDER — HYDROMORPHONE HCL 1 MG/ML IJ SOLN
INTRAMUSCULAR | Status: AC
  Filled 2017-04-05: qty 1

## 2017-04-05 MED ORDER — HYDROMORPHONE HCL 1 MG/ML IJ SOLN
1.0000 mg | Freq: Once | INTRAMUSCULAR | Status: AC
  Administered 2017-04-05: 1 mg via INTRAVENOUS
  Filled 2017-04-05: qty 1

## 2017-04-05 MED ORDER — LIDOCAINE 2% (20 MG/ML) 5 ML SYRINGE
INTRAMUSCULAR | Status: AC
Start: 2017-04-05 — End: 2017-04-05
  Filled 2017-04-05: qty 5

## 2017-04-05 MED ORDER — KETOROLAC TROMETHAMINE 30 MG/ML IJ SOLN
30.0000 mg | Freq: Three times a day (TID) | INTRAMUSCULAR | Status: AC
  Administered 2017-04-05 – 2017-04-07 (×6): 30 mg via INTRAVENOUS
  Filled 2017-04-05 (×6): qty 1

## 2017-04-05 MED ORDER — ACETAMINOPHEN 325 MG PO TABS
650.0000 mg | ORAL_TABLET | Freq: Four times a day (QID) | ORAL | Status: DC
  Administered 2017-04-05 – 2017-04-08 (×9): 650 mg via ORAL
  Filled 2017-04-05 (×9): qty 2

## 2017-04-05 MED ORDER — PROPOFOL 10 MG/ML IV BOLUS
INTRAVENOUS | Status: AC
  Filled 2017-04-05: qty 20

## 2017-04-05 MED ORDER — OXYCODONE HCL 5 MG PO TABS
ORAL_TABLET | ORAL | Status: AC
  Filled 2017-04-05: qty 2

## 2017-04-05 SURGICAL SUPPLY — 42 items
APPLIER CLIP 9.375 MED OPEN (MISCELLANEOUS)
BENZOIN TINCTURE PRP APPL 2/3 (GAUZE/BANDAGES/DRESSINGS) IMPLANT
BINDER BREAST LRG (GAUZE/BANDAGES/DRESSINGS) ×2 IMPLANT
BLADE SURG 15 STRL LF DISP TIS (BLADE) ×1 IMPLANT
BLADE SURG 15 STRL SS (BLADE) ×1
BNDG GAUZE ELAST 4 BULKY (GAUZE/BANDAGES/DRESSINGS) ×2 IMPLANT
CANISTER SUCTION 1200CC (MISCELLANEOUS) IMPLANT
CHLORAPREP W/TINT 26ML (MISCELLANEOUS) IMPLANT
CLIP APPLIE 9.375 MED OPEN (MISCELLANEOUS) IMPLANT
DECANTER SPIKE VIAL GLASS SM (MISCELLANEOUS) IMPLANT
DERMABOND ADVANCED (GAUZE/BANDAGES/DRESSINGS)
DERMABOND ADVANCED .7 DNX12 (GAUZE/BANDAGES/DRESSINGS) IMPLANT
DRAPE LAPAROTOMY T 98X78 PEDS (DRAPES) ×2 IMPLANT
DRAPE UTILITY XL STRL (DRAPES) IMPLANT
DRSG TEGADERM 4X4.75 (GAUZE/BANDAGES/DRESSINGS) IMPLANT
ELECT COATED BLADE 2.86 ST (ELECTRODE) ×2 IMPLANT
ELECT PENCIL ROCKER SW 15FT (MISCELLANEOUS) ×2 IMPLANT
ELECT REM PT RETURN 15FT ADLT (MISCELLANEOUS) ×2 IMPLANT
GAUZE SPONGE 4X4 12PLY STRL (GAUZE/BANDAGES/DRESSINGS) ×2 IMPLANT
GLOVE BIO SURGEON STRL SZ7.5 (GLOVE) IMPLANT
GLOVE BIOGEL PI IND STRL 8 (GLOVE) IMPLANT
GLOVE BIOGEL PI INDICATOR 8 (GLOVE)
GOWN STRL REUS W/ TWL XL LVL3 (GOWN DISPOSABLE) ×1 IMPLANT
GOWN STRL REUS W/TWL XL LVL3 (GOWN DISPOSABLE) ×1
KIT BASIN OR (CUSTOM PROCEDURE TRAY) ×2 IMPLANT
KIT MARKER MARGIN INK (KITS) IMPLANT
NEEDLE HYPO 25X1 1.5 SAFETY (NEEDLE) IMPLANT
NS IRRIG 1000ML POUR BTL (IV SOLUTION) ×2 IMPLANT
PACK BASIC VI WITH GOWN DISP (CUSTOM PROCEDURE TRAY) ×2 IMPLANT
PAD ABD 8X10 STRL (GAUZE/BANDAGES/DRESSINGS) ×2 IMPLANT
SPONGE LAP 18X18 X RAY DECT (DISPOSABLE) ×2 IMPLANT
SPONGE LAP 4X18 X RAY DECT (DISPOSABLE) ×2 IMPLANT
STRIP CLOSURE SKIN 1/2X4 (GAUZE/BANDAGES/DRESSINGS) IMPLANT
SUT MNCRL AB 4-0 PS2 18 (SUTURE) IMPLANT
SUT SILK 2 0 SH (SUTURE) IMPLANT
SUT VIC AB 3-0 SH 8-18 (SUTURE) IMPLANT
SWAB COLLECTION DEVICE MRSA (MISCELLANEOUS) ×2 IMPLANT
SYR BULB IRRIGATION 50ML (SYRINGE) ×2 IMPLANT
SYR CONTROL 10ML LL (SYRINGE) IMPLANT
TOWEL OR 17X26 10 PK STRL BLUE (TOWEL DISPOSABLE) ×2 IMPLANT
TOWEL OR NON WOVEN STRL DISP B (DISPOSABLE) ×2 IMPLANT
YANKAUER SUCT BULB TIP NO VENT (SUCTIONS) ×2 IMPLANT

## 2017-04-05 NOTE — Telephone Encounter (Signed)
Called and left patient messages to call back and reschedule her missed appointment for today for "one week follow up" with Dr. Oscar LaJertson.

## 2017-04-05 NOTE — Anesthesia Preprocedure Evaluation (Addendum)
Anesthesia Evaluation  Patient identified by MRN, date of birth, ID band Patient awake    Reviewed: Allergy & Precautions, H&P , NPO status , Patient's Chart, lab work & pertinent test results  Airway Mallampati: II  TM Distance: >3 FB Neck ROM: Full    Dental no notable dental hx. (+) Teeth Intact, Dental Advisory Given   Pulmonary neg pulmonary ROS,    Pulmonary exam normal breath sounds clear to auscultation       Cardiovascular negative cardio ROS   Rhythm:Regular Rate:Normal     Neuro/Psych Anxiety Depression negative neurological ROS     GI/Hepatic negative GI ROS, Neg liver ROS,   Endo/Other  negative endocrine ROS  Renal/GU negative Renal ROS  negative genitourinary   Musculoskeletal   Abdominal   Peds  Hematology negative hematology ROS (+)   Anesthesia Other Findings   Reproductive/Obstetrics negative OB ROS                            Anesthesia Physical Anesthesia Plan  ASA: II  Anesthesia Plan: General   Post-op Pain Management:    Induction: Intravenous  PONV Risk Score and Plan: 4 or greater and Ondansetron, Dexamethasone and Midazolam  Airway Management Planned: LMA  Additional Equipment:   Intra-op Plan:   Post-operative Plan: Extubation in OR  Informed Consent: I have reviewed the patients History and Physical, chart, labs and discussed the procedure including the risks, benefits and alternatives for the proposed anesthesia with the patient or authorized representative who has indicated his/her understanding and acceptance.   Dental advisory given  Plan Discussed with: CRNA  Anesthesia Plan Comments:         Anesthesia Quick Evaluation

## 2017-04-05 NOTE — Progress Notes (Signed)
Patient did not tolerate dressing change well, crying, screaming, and hyperventilating. Nurse asked a second nurse to assist. About a foot of the packing was pulled out, when patient could not continue - Dr Andrey CampanileWilson was paged for medication to assist with pain management/anxiety. Orders were entered by MD. After administering said medication, and allowing time for effects, patient states it only makes her feel loopy and doesn't help with the pain. When beginning to pull the packing out 2 more inches she begins screaming again and refuses to continue with the change. Dr Andrey CampanileWilson was notified and stated to cover with gauze/ABD pad.

## 2017-04-05 NOTE — Anesthesia Postprocedure Evaluation (Signed)
Anesthesia Post Note  Patient: April Rivas  Procedure(s) Performed: IRRIGATION AND DEBRIDMENT LEFT  BREAST ABCESS (Left )     Patient location during evaluation: PACU Anesthesia Type: General Level of consciousness: awake and alert Pain management: pain level controlled Vital Signs Assessment: post-procedure vital signs reviewed and stable Respiratory status: spontaneous breathing, nonlabored ventilation, respiratory function stable and patient connected to nasal cannula oxygen Cardiovascular status: blood pressure returned to baseline and stable Postop Assessment: no apparent nausea or vomiting Anesthetic complications: no    Last Vitals:  Vitals:   04/05/17 1215 04/05/17 1223  BP: (!) 131/95 129/87  Pulse: 98 84  Resp: (!) 21 10  Temp:  36.8 C  SpO2: 99% 99%    Last Pain:  Vitals:   04/05/17 1145  TempSrc:   PainSc: 5                  Corvin Sorbo,W. EDMOND

## 2017-04-05 NOTE — Anesthesia Postprocedure Evaluation (Deleted)
Anesthesia Post Note  Patient: April Rivas  Procedure(s) Performed: IRRIGATION AND DEBRIDMENT LEFT  BREAST ABCESS (Left )     Patient location during evaluation: PACU Anesthesia Type: General Level of consciousness: awake and alert Pain management: pain level controlled Vital Signs Assessment: post-procedure vital signs reviewed and stable Respiratory status: spontaneous breathing, nonlabored ventilation, respiratory function stable and patient connected to nasal cannula oxygen Cardiovascular status: blood pressure returned to baseline and stable Postop Assessment: no apparent nausea or vomiting Anesthetic complications: no    Last Vitals:  Vitals:   04/05/17 0537 04/05/17 0954  BP: (!) 144/90 (!) 159/113  Pulse: (!) 114 (!) 108  Resp: 17 (!) 6  Temp: 37.9 C 36.6 C  SpO2: 97% 100%    Last Pain:  Vitals:   04/05/17 1045  TempSrc:   PainSc: Asleep                 Nikolina Simerson,W. EDMOND

## 2017-04-05 NOTE — Transfer of Care (Signed)
Immediate Anesthesia Transfer of Care Note  Patient: April Rivas  Procedure(s) Performed: IRRIGATION AND DEBRIDMENT LEFT  BREAST ABCESS (Left )  Patient Location: PACU  Anesthesia Type:General  Level of Consciousness: awake, alert  and oriented  Airway & Oxygen Therapy: Patient Spontanous Breathing and Patient connected to face mask oxygen  Post-op Assessment: Report given to RN and Post -op Vital signs reviewed and stable  Post vital signs: Reviewed and stable  Last Vitals:  Vitals:   04/04/17 2003 04/05/17 0537  BP: (!) 141/92 (!) 144/90  Pulse: 96 (!) 114  Resp: 15 17  Temp: 37.6 C 37.9 C  SpO2: 100% 97%    Last Pain:  Vitals:   04/05/17 0537  TempSrc: Oral  PainSc:          Complications: No apparent anesthesia complications

## 2017-04-05 NOTE — Anesthesia Procedure Notes (Signed)
Procedure Name: LMA Insertion Date/Time: 04/05/2017 9:17 AM Performed by: Dola Lunsford D, CRNA Pre-anesthesia Checklist: Patient identified, Emergency Drugs available, Suction available and Patient being monitored Patient Re-evaluated:Patient Re-evaluated prior to induction Oxygen Delivery Method: Circle system utilized Preoxygenation: Pre-oxygenation with 100% oxygen Induction Type: IV induction Ventilation: Mask ventilation without difficulty LMA: LMA inserted LMA Size: 4.0 Tube type: Oral Number of attempts: 1 Placement Confirmation: positive ETCO2 and breath sounds checked- equal and bilateral Tube secured with: Tape Dental Injury: Teeth and Oropharynx as per pre-operative assessment

## 2017-04-05 NOTE — Op Note (Addendum)
04/05/2017  9:50 AM  PATIENT:  April Rivas  27 y.o. female  Patient Care Team: Laurey Morale, MD as PCP - General  PRE-OPERATIVE DIAGNOSIS:  left breast abcess  POST-OPERATIVE DIAGNOSIS:  left breast abcess  PROCEDURE:   INCISION AND DRAINAGE LEFT  BREAST ABCESS   Surgeon(s): Leighton Ruff, MD  ASSISTANT: none   ANESTHESIA:   local and MAC  EBL:  61m  DRAINS: none   SPECIMEN:  Source of Specimen:  culture  DISPOSITION OF SPECIMEN:  Micro  COUNTS:  YES  PLAN OF CARE: Pt admitted  PATIENT DISPOSITION:  PACU - hemodynamically stable.  INDICATION: 27y.o. F with 3 wk h/o breast abscess treated with frequent aspirations and antibiotics.  She has failed this treatment course and it was recommended that she come to the hospital for official I&D.     OR FINDINGS: ~6x6cm subareolar abscess  DESCRIPTION: the patient was identified in the preoperative holding area and taken to the OR where they were laid supine on the operating room table.  MAC anesthesia was induced without difficulty. SCDs were also noted to be in place prior to the initiation of anesthesia.  The patient was then prepped and draped in the usual sterile fashion.   A surgical timeout was performed indicating the correct patient, procedure, positioning and need for preoperative antibiotics.   I began by making an incision at the junction of the areolar and skin.  This was carried down through the subcutaneous tissues using electrocautery.  An ellipse of skin was removed.  The cavity was entered without difficulty.  Cultures were obtained.  The cavity was then aspirated.  It was packed with a sponge pressure dressing for hemostasis.  After 2 minutes we removed the sponge and evaluated the cavity.  There was no obvious bleeding.  The cavity was repacked with Kerlix gauze.  A dressing was applied.  The patient was awakened from anesthesia and sent to the postanesthesia care unit in stable condition.  All counts  were correct per operating room staff.

## 2017-04-06 NOTE — Progress Notes (Signed)
Patient ID: April Rivas, female   DOB: 10/27/1990, 27 y.o.   MRN: [id number]   Acute Care Surgery Service Progress Note:    Chief Complaint/Subjective: Had extreme pain with attempted dressing change last pm and it was aborted.  Pain regimen changed around Don't really have pain at rest - only had 15/10 pain with attempted dressing change Asks about IV sedation and numbing injection   Objective: Vital signs in last 24 hours: Temp:  [97.6 F (36.4 C)-98.5 F (36.9 C)] 97.9 F (36.6 C) (02/23 0530) Pulse Rate:  [63-108] 73 (02/23 0530) Resp:  [5-20] 18 (02/23 0530) BP: (106-159)/(66-113) 106/66 (02/23 0530) SpO2:  [95 %-100 %] 98 % (02/23 0530) Last BM Date: 04/04/17  Intake/Output from previous day: 02/22 0701 - 02/23 0700 In: 1980 [P.O.:240; I.V.:1640; IV Piggyback:100] Out: 300 [Urine:300] Intake/Output this shift: Total I/O In: 150 [P.O.:150] Out: -   Lungs: cta, nonlabored  Cardiovascular: reg  Breast: nurse chaperone; some bloody drainage on abd pad, packing left in. Still with about 3-4 cm circumferential cellulitis around open wound  Extremities: no edema, +SCDs  Neuro: alert, nonfocal  Lab Results: CBC  Recent Labs    04/04/17 1729  WBC 17.5*  HGB 12.2  HCT 36.5  PLT 373   BMET Recent Labs    04/04/17 1729  NA 138  K 3.6  CL 102  CO2 24  GLUCOSE 123*  BUN 9  CREATININE 0.54  CALCIUM 9.0   LFT Hepatic Function Latest Ref Rng & Units 03/09/2014 08/29/2012 02/26/2012  Total Protein 6.0 - 8.3 g/dL 7.4 7.3 7.7  Albumin 3.5 - 5.2 g/dL 4.0 4.0 4.1  AST 0 - 37 U/L 16 17 17   ALT 0 - 35 U/L 16 17 15   Alk Phosphatase 39 - 117 U/L 69 84 83  Total Bilirubin 0.2 - 1.2 mg/dL 0.4 0.4 0.7  Bilirubin, Direct 0.0 - 0.3 mg/dL - 0.1 0.0   PT/INR No results for input(s): LABPROT, INR in the last 72 hours. ABG No results for input(s): PHART, HCO3 in the last 72 hours.  Invalid input(s): PCO2, PO2  Studies/Results:  Anti-infectives: Anti-infectives  (From admission, onward)   Start     Dose/Rate Route Frequency Ordered Stop   04/04/17 1830  piperacillin-tazobactam (ZOSYN) IVPB 3.375 g     3.375 g 12.5 mL/hr over 240 Minutes Intravenous Every 8 hours 04/04/17 1650        Medications: Scheduled Meds: . acetaminophen  650 mg Oral Q6H  . docusate sodium  100 mg Oral BID  . enoxaparin (LOVENOX) injection  40 mg Subcutaneous Q24H  . ketorolac  30 mg Intravenous Q8H  . pantoprazole  40 mg Oral Daily   Continuous Infusions: . 0.9 % NaCl with KCl 20 mEq / L 10 mL/hr at 04/06/17 0600  . lactated ringers 10 mL/hr at 04/05/17 2200  . piperacillin-tazobactam (ZOSYN)  IV 3.375 g (04/06/17 0615)   PRN Meds:.HYDROmorphone (DILAUDID) injection, LORazepam, ondansetron **OR** ondansetron (ZOFRAN) IV, oxyCODONE  Assessment/Plan: Patient Active Problem List   Diagnosis Date Noted  . Left breast abscess 04/04/2017  . Recurrent tonsillitis 06/11/2016  . Eczema 09/14/2015  . Depression with anxiety 03/01/2014  . ANKLE PAIN, LEFT 07/20/2009  . ROTATOR CUFF INJURY, LEFT SHOULDER 07/20/2009  . OTHER ACQUIRED DEFORMITY OF ANKLE AND FOOT OTHER 07/20/2009  . AMENORRHEA, PRIMARY 09/10/2008  . CERVICAL LYMPHADENOPATHY 09/10/2008   s/p Procedure(s): IRRIGATION AND DEBRIDMENT LEFT  BREAST ABCESS 04/05/2017  Explained rationale why IV sedation/numbing injection not viable  options Will not do dressing change today - will let it cool off Cont IV abx given degree of cellulitis Explained nature of cellulitis and open wounds and how that can worsen pain.  Ambulate   Disposition:  LOS: 2 days    Leighton Ruff. Redmond Pulling, MD, FACS General, Bariatric, & Minimally Invasive Surgery [phone] [facility]

## 2017-04-07 LAB — BASIC METABOLIC PANEL
ANION GAP: 12 (ref 5–15)
BUN: 10 mg/dL (ref 6–20)
CHLORIDE: 104 mmol/L (ref 101–111)
CO2: 24 mmol/L (ref 22–32)
Calcium: 8.4 mg/dL — ABNORMAL LOW (ref 8.9–10.3)
Creatinine, Ser: 0.63 mg/dL (ref 0.44–1.00)
Glucose, Bld: 101 mg/dL — ABNORMAL HIGH (ref 65–99)
POTASSIUM: 3.8 mmol/L (ref 3.5–5.1)
SODIUM: 140 mmol/L (ref 135–145)

## 2017-04-07 LAB — AEROBIC CULTURE W GRAM STAIN (SUPERFICIAL SPECIMEN)

## 2017-04-07 LAB — AEROBIC CULTURE  (SUPERFICIAL SPECIMEN)

## 2017-04-07 MED ORDER — LORAZEPAM 2 MG/ML IJ SOLN
1.0000 mg | Freq: Three times a day (TID) | INTRAMUSCULAR | Status: DC | PRN
  Administered 2017-04-07: 1 mg via INTRAVENOUS
  Filled 2017-04-07: qty 1

## 2017-04-07 NOTE — Progress Notes (Signed)
Patient ID: April Rivas, female   DOB: 01/01/1991, 27 y.o.   MRN: [id number]   Acute Care Surgery Service Progress Note:    Chief Complaint/Subjective: Less pain but apprehensive about dressing change. No n/v ambulated  Objective: Vital signs in last 24 hours: Temp:  [97.4 F (36.3 C)-98.6 F (37 C)] 98.6 F (37 C) (02/24 0514) Pulse Rate:  [76-83] 83 (02/24 0514) Resp:  [16-117] 117 (02/24 0514) BP: (117-121)/(73-91) 117/91 (02/24 0514) SpO2:  [95 %-100 %] 100 % (02/24 0514) Last BM Date: 04/06/17  Intake/Output from previous day: 02/23 0701 - 02/24 0700 In: 1993 [P.O.:1620; I.V.:223; IV Piggyback:150] Out: -  Intake/Output this shift: Total I/O In: 240 [P.O.:240] Out: -   Lungs: cta, nonlabored  Cardiovascular: reg  Breast - improved cellulitis and induration, still about 2.5-3cm surrounding skin hyperemia around nipple - but much less intense  Extremities: no edema, +SCDs  Neuro: alert, nonfocal  Lab Results: CBC  Recent Labs    04/04/17 1729  WBC 17.5*  HGB 12.2  HCT 36.5  PLT 373   BMET Recent Labs    04/04/17 1729 04/07/17 0526  NA 138 140  K 3.6 3.8  CL 102 104  CO2 24 24  GLUCOSE 123* 101*  BUN 9 10  CREATININE 0.54 0.63  CALCIUM 9.0 8.4*   LFT Hepatic Function Latest Ref Rng & Units 03/09/2014 08/29/2012 02/26/2012  Total Protein 6.0 - 8.3 g/dL 7.4 7.3 7.7  Albumin 3.5 - 5.2 g/dL 4.0 4.0 4.1  AST 0 - 37 U/L 16 17 17   ALT 0 - 35 U/L 16 17 15   Alk Phosphatase 39 - 117 U/L 69 84 83  Total Bilirubin 0.2 - 1.2 mg/dL 0.4 0.4 0.7  Bilirubin, Direct 0.0 - 0.3 mg/dL - 0.1 0.0   PT/INR No results for input(s): LABPROT, INR in the last 72 hours. ABG No results for input(s): PHART, HCO3 in the last 72 hours.  Invalid input(s): PCO2, PO2  Studies/Results:  Anti-infectives: Anti-infectives (From admission, onward)   Start     Dose/Rate Route Frequency Ordered Stop   04/04/17 1830  piperacillin-tazobactam (ZOSYN) IVPB 3.375 g     3.375  g 12.5 mL/hr over 240 Minutes Intravenous Every 8 hours 04/04/17 1650        Medications: Scheduled Meds: . acetaminophen  650 mg Oral Q6H  . docusate sodium  100 mg Oral BID  . enoxaparin (LOVENOX) injection  40 mg Subcutaneous Q24H  . ketorolac  30 mg Intravenous Q8H  . pantoprazole  40 mg Oral Daily   Continuous Infusions: . 0.9 % NaCl with KCl 20 mEq / L 10 mL/hr at 04/06/17 0600  . lactated ringers 10 mL/hr at 04/05/17 2200  . piperacillin-tazobactam (ZOSYN)  IV 3.375 g (04/07/17 0514)   PRN Meds:.HYDROmorphone (DILAUDID) injection, LORazepam, ondansetron **OR** ondansetron (ZOFRAN) IV, oxyCODONE  Assessment/Plan: Patient Active Problem List   Diagnosis Date Noted  . Left breast abscess 04/04/2017  . Recurrent tonsillitis 06/11/2016  . Eczema 09/14/2015  . Depression with anxiety 03/01/2014  . ANKLE PAIN, LEFT 07/20/2009  . ROTATOR CUFF INJURY, LEFT SHOULDER 07/20/2009  . OTHER ACQUIRED DEFORMITY OF ANKLE AND FOOT OTHER 07/20/2009  . AMENORRHEA, PRIMARY 09/10/2008  . CERVICAL LYMPHADENOPATHY 09/10/2008   s/p Procedure(s): IRRIGATION AND DEBRIDMENT LEFT  BREAST ABCESS 04/05/2017  Cont iv abx F/u cx Have a regimen in place for 1st dressing change today - oral oxycodone and ativan prior to change  Disposition: hopefully home monday  LOS: 3 days  Leighton Ruff. Redmond Pulling, MD, FACS General, Bariatric, & Minimally Invasive Surgery [phone] [facility]

## 2017-04-08 ENCOUNTER — Telehealth: Payer: Self-pay | Admitting: Obstetrics and Gynecology

## 2017-04-08 MED ORDER — OXYCODONE HCL 5 MG PO TABS: 5.0000 mg | ORAL_TABLET | Freq: Four times a day (QID) | ORAL | 0 refills | Status: DC | PRN

## 2017-04-08 MED ORDER — AMOXICILLIN-POT CLAVULANATE 875-125 MG PO TABS
1.0000 | ORAL_TABLET | Freq: Two times a day (BID) | ORAL | Status: DC
  Administered 2017-04-08 – 2017-04-09 (×2): 1 via ORAL
  Filled 2017-04-08 (×2): qty 1

## 2017-04-08 MED ORDER — CODEINE SULFATE 30 MG PO TABS
30.0000 mg | ORAL_TABLET | ORAL | Status: DC | PRN
  Administered 2017-04-09: 30 mg via ORAL
  Filled 2017-04-08: qty 1

## 2017-04-08 MED ORDER — IBUPROFEN 200 MG PO TABS
600.0000 mg | ORAL_TABLET | Freq: Four times a day (QID) | ORAL | Status: DC | PRN
  Administered 2017-04-09: 600 mg via ORAL
  Filled 2017-04-08: qty 3

## 2017-04-08 MED ORDER — FLUOXETINE HCL 20 MG PO CAPS
20.0000 mg | ORAL_CAPSULE | Freq: Every day | ORAL | Status: DC
  Administered 2017-04-08 – 2017-04-09 (×2): 20 mg via ORAL
  Filled 2017-04-08 (×2): qty 1

## 2017-04-08 MED ORDER — ACETAMINOPHEN 500 MG PO TABS
1000.0000 mg | ORAL_TABLET | Freq: Three times a day (TID) | ORAL | Status: DC
  Administered 2017-04-08 – 2017-04-09 (×2): 1000 mg via ORAL
  Filled 2017-04-08 (×2): qty 2

## 2017-04-08 MED ORDER — FLUOXETINE HCL 20 MG PO TABS
20.0000 mg | ORAL_TABLET | Freq: Every day | ORAL | Status: DC
  Filled 2017-04-08: qty 1

## 2017-04-08 MED ORDER — AMOXICILLIN-POT CLAVULANATE 875-125 MG PO TABS: 1.0000 | ORAL_TABLET | Freq: Two times a day (BID) | ORAL | 0 refills | Status: AC

## 2017-04-08 MED ORDER — LORAZEPAM 0.5 MG PO TABS
0.5000 mg | ORAL_TABLET | Freq: Three times a day (TID) | ORAL | Status: DC | PRN
  Administered 2017-04-08 – 2017-04-09 (×3): 0.5 mg via ORAL
  Filled 2017-04-08 (×3): qty 1

## 2017-04-08 MED ORDER — HYDROMORPHONE HCL 2 MG PO TABS
1.0000 mg | ORAL_TABLET | Freq: Four times a day (QID) | ORAL | Status: DC | PRN
  Administered 2017-04-08 (×2): 1 mg via ORAL
  Filled 2017-04-08 (×2): qty 1

## 2017-04-08 NOTE — Telephone Encounter (Signed)
Patient is calling to let Dr. Oscar LaJertson know that she is in the hospital. She had an appointment on 04/05/17 that she missed, but she was having surgery on that day.

## 2017-04-08 NOTE — Telephone Encounter (Signed)
Please let her know that I am so glad that she is finally on the mend. I'm sure she will have f/u with surgery. She does need to have a f/u prolactin level when she has completely healed, I think it is okay to do this at her annual exam in May. Tell her to call if we can help her in any way.

## 2017-04-08 NOTE — Telephone Encounter (Signed)
Spoke with patient, advised as seen below per Dr. Oscar LaJertson.   Patient wanted to update Dr. Oscar LaJertson, menses started 04/03/17, no provera needed.    Routing to provider for final review. Patient is agreeable to disposition. Will close encounter.

## 2017-04-08 NOTE — Discharge Summary (Addendum)
[facility] Discharge Summary   Patient ID: April Rivas MRN: [id number] DOB/AGE: 25/02/1990 26 y.o.  Admit date: 04/04/2017 Discharge date: 04/08/2017  Admitting Diagnosis: Left breast abscess  Discharge Diagnosis Left breast abscess - s/p I&D  Consultants None  Imaging: No results found.  Procedures Dr. Maisie Fushomas (04/05/17) - Incision and drainage  Hospital Course:  Patient is a 27 year old female who was referred from the breast center for left breast abscess.  Patient was admitted and underwent procedure listed above.  Tolerated procedure well and was transferred to the floor.  Diet was advanced as tolerated.  On POD#3, the patient was voiding well, tolerating diet, ambulating well, pain well controlled, vital signs stable, incisions c/d/i and felt stable for discharge home.  Patient will follow up in our office later this week and knows to call with questions or concerns. She will call to confirm appointment date/time.     Allergies as of 04/08/2017      Reactions   Benzoyl Peroxide    Caused skin to be scaly, rough & bumpy all over body      Medication List    STOP taking these medications   oxyCODONE-acetaminophen 5-325 MG tablet Commonly known as:  PERCOCET   penicillin v potassium 500 MG tablet Commonly known as:  VEETID   sulfamethoxazole-trimethoprim 800-160 MG tablet Commonly known as:  BACTRIM DS     TAKE these medications   acetaminophen 500 MG tablet Commonly known as:  TYLENOL Take 500 mg by mouth every 6 (six) hours as needed.   amoxicillin-clavulanate 875-125 MG tablet Commonly known as:  AUGMENTIN Take 1 tablet by mouth every 12 (twelve) hours for 10 days.   FLUoxetine 20 MG tablet Commonly known as:  PROZAC TAKE 1 TABLET BY MOUTH ONCE DAILY   ibuprofen 800 MG tablet Commonly known as:  ADVIL,MOTRIN Take 1 tablet (800 mg total) by mouth every 8 (eight) hours as needed.   LORazepam 0.5 MG tablet Commonly known as:   ATIVAN Take 1 tablet (0.5 mg total) by mouth every 8 (eight) hours as needed for anxiety.   medroxyPROGESTERone 5 MG tablet Commonly known as:  PROVERA Take one tablet a day for 5 days.   MULTIVITAMIN PO Take by mouth daily.   oxyCODONE 5 MG immediate release tablet Commonly known as:  Oxy IR/ROXICODONE Take 1-2 tablets (5-10 mg total) by mouth every 6 (six) hours as needed for moderate pain.   SKYLA 13.5 MG Iud Generic drug:  Levonorgestrel by Intrauterine route. Inserted 07/05/15        Follow-up Information    [facility] Go on 04/11/2017.   Specialty:  General Surgery Why:  Your appointment is at 2:30 PM. Please arrive 30 min prior to appointment time. Bring photo ID and insurance information.  Contact information: [address] [place] [id number] [phone]          Signed: Wells GuilesKelly Rayburn, [facility] 04/08/2017, 12:09 PM Pager: [phone] Consults: [phone]  Agree with above. Difficult patient.  Her parents don't want to participate in her care.  Kelly spent a lot of time arranging her post op care.  Ovidio Kinavid Osborne Serio, MD, [facility] Pager: [phone] Office phone:  [phone]

## 2017-04-08 NOTE — Progress Notes (Signed)
Spoke with parent and patient at bedside. [facility]. No preference of agency. Contacted multiple agencies, none would accept referral. Notified PA and nurse, will likely need to continue teaching patient and parents regarding wound care. [phone]

## 2017-04-08 NOTE — Progress Notes (Signed)
[facility] Office:  [phone] General Surgery Progress Note   LOS: 4 days  POD -  3 Days Post-Op  Chief Complaint: Left breast pain  Assessment and Plan: 1.  IRRIGATION AND DEBRIDMENT LEFT  BREAST ABCESS - 03/05/2017 - Maisie Fushomas  Zosyn  To shower to change bandage.  She should be able to go home later today.  2.  Depression/anxiety 3.  DVT prophylaxis - Lovenox   Active Problems:   Left breast abscess   Subjective:  Doing better.  Does not like "cold" saline on the wound.   So we are going to let her get in the shower and try to change the dressing that way.  She can go home later today.  She has a removable shower head.  Objective:   Vitals:   04/07/17 2159 04/08/17 0557  BP: 120/74 (!) 99/56  Pulse: 84 80  Resp: 18 18  Temp: 98.2 F (36.8 C) 97.8 F (36.6 C)  SpO2: 98% 98%     Intake/Output from previous day:  02/24 0701 - 02/25 0700 In: 1930 [P.O.:1200; I.V.:580; IV Piggyback:150] Out: 5 [Urine:5]  Intake/Output this shift:  Total I/O In: -  Out: 1 [Urine:1]   Physical Exam:   General: Younger F who is alert and oriented.    HEENT: Normal. Pupils equal.   Left breast:  3 cm open wound in the LOQ of the areola.  Outside looks clean, but she would not let remove the bandage, so she is going to do this in the shower.   Right breast:  She has a right nipple ring - I warned about the risks of this, but she argued with me.   Lab Results:   No results for input(s): WBC, HGB, HCT, PLT in the last 72 hours.  BMET   Recent Labs    04/07/17 0526  NA 140  K 3.8  CL 104  CO2 24  GLUCOSE 101*  BUN 10  CREATININE 0.63  CALCIUM 8.4*    PT/INR  No results for input(s): LABPROT, INR in the last 72 hours.  ABG  No results for input(s): PHART, HCO3 in the last 72 hours.  Invalid input(s): PCO2, PO2   Studies/Results:  No results found.   Anti-infectives:   Anti-infectives (From admission, onward)   Start     Dose/Rate Route Frequency  Ordered Stop   04/04/17 1830  piperacillin-tazobactam (ZOSYN) IVPB 3.375 g     3.375 g 12.5 mL/hr over 240 Minutes Intravenous Every 8 hours 04/04/17 1650        Ovidio Kinavid Shaheen Mende, MD, FACS Pager: [phone] [facility] Office: [phone] 04/08/2017

## 2017-04-08 NOTE — Telephone Encounter (Signed)
Spoke with patient. [facility] had I&D for left breast abscess on 2/22. Patient states she plans to be discharged today.  Advised patient I will update Dr. Oscar LaJertson, return call to office for any further questions or assistance. Patient verbalizes understanding and is agreeable.    Routing to Dr. Shirley FriarJertson FYI, any additional recommendations?

## 2017-04-09 LAB — ANAEROBIC CULTURE

## 2017-04-09 LAB — BASIC METABOLIC PANEL
Anion gap: 9 (ref 5–15)
BUN: 17 mg/dL (ref 6–20)
CHLORIDE: 106 mmol/L (ref 101–111)
CO2: 25 mmol/L (ref 22–32)
CREATININE: 0.63 mg/dL (ref 0.44–1.00)
Calcium: 8.8 mg/dL — ABNORMAL LOW (ref 8.9–10.3)
Glucose, Bld: 124 mg/dL — ABNORMAL HIGH (ref 65–99)
POTASSIUM: 3.7 mmol/L (ref 3.5–5.1)
SODIUM: 140 mmol/L (ref 135–145)

## 2017-04-09 LAB — CBC
HCT: 35.2 % — ABNORMAL LOW (ref 36.0–46.0)
HEMOGLOBIN: 11.4 g/dL — AB (ref 12.0–15.0)
MCH: 27.3 pg (ref 26.0–34.0)
MCHC: 32.4 g/dL (ref 30.0–36.0)
MCV: 84.4 fL (ref 78.0–100.0)
PLATELETS: 380 10*3/uL (ref 150–400)
RBC: 4.17 MIL/uL (ref 3.87–5.11)
RDW: 13.5 % (ref 11.5–15.5)
WBC: 7 10*3/uL (ref 4.0–10.5)

## 2017-04-09 MED ORDER — CODEINE SULFATE 30 MG PO TABS: 30.0000 mg | ORAL_TABLET | Freq: Four times a day (QID) | ORAL | 0 refills | Status: DC | PRN

## 2017-04-09 MED ORDER — CODEINE SULFATE 30 MG PO TABS
15.0000 mg | ORAL_TABLET | ORAL | Status: DC | PRN
  Administered 2017-04-09: 15 mg via ORAL
  Filled 2017-04-09: qty 1

## 2017-04-09 MED ORDER — ACETAMINOPHEN 500 MG PO TABS: 1000.0000 mg | ORAL_TABLET | Freq: Four times a day (QID) | ORAL | 0 refills | Status: AC | PRN

## 2017-04-09 NOTE — Discharge Summary (Addendum)
[facility] Discharge Summary   Patient ID: Bess Harvestichole Montefusco MRN: [id number] DOB/AGE: 25/02/1990 26 y.o.  Admit date: 04/04/2017 Discharge date: 04/09/2017  Admitting Diagnosis: Left breast abscess  Discharge Diagnosis Left breast abscess - s/p I&D  Consultants None  Imaging: ImagingResults(Last48hours)  No results found.    Procedures Dr. Maisie Fushomas (04/05/17) - Incision and drainage  Hospital Course:  Patient is a 27 year old female who was referred from the breast center for left breast abscess.  Patient was admitted and underwent procedure listed above.  Tolerated procedure well and was transferred to the floor.  Diet was advanced as tolerated.  On POD#4, the patient was voiding well, tolerating diet, ambulating well, vital signs stable, incisions c/d/i and felt stable for discharge home. Patient originally to be discharged yesterday but patient and family members unwilling to do dressing changes at home. Patient denied home health initially, but case management was able to arrange 2/26. Arrangements made for patient to be seen daily in out office for continued wound care teaching and assistance. Patient and family report that a family friend will be assisting them with evening dressing changes. Patient will follow up in our office tomorrow morning for dressing change and knows to call with questions or concerns.   Physical Exam: General:  Alert, NAD Respiratory: normal effort Breast: R breast with nipple piercing present; L breast with 3 cm wound in LOQ of areola, outer dressing c/d/i  I have personally looked this patient up in the [place] Controlled Substance Database and reviewed their medications. Patient refused prescription for oxycodone IR tablets, I called the pharmacy and notified them of this. Patient requested PO dilaudid. I explained to patient that I am not comfortable prescribing this and expressed that if patient is still having pain with alternative  medication that I recommend continued admission for pain control with dressing changes. Patient stated that she would not be amenable to staying admitted another night. Alternative pain regimen agreed upon and prescription for PO codeine sent to patient's pharmacy.   Allergies as of 04/09/2017      Reactions   Benzoyl Peroxide    Caused skin to be scaly, rough & bumpy all over body      Medication List    STOP taking these medications   oxyCODONE-acetaminophen 5-325 MG tablet Commonly known as:  PERCOCET   penicillin v potassium 500 MG tablet Commonly known as:  VEETID   sulfamethoxazole-trimethoprim 800-160 MG tablet Commonly known as:  BACTRIM DS     TAKE these medications   acetaminophen 500 MG tablet Commonly known as:  TYLENOL Take 2 tablets (1,000 mg total) by mouth every 6 (six) hours as needed. What changed:  how much to take   amoxicillin-clavulanate 875-125 MG tablet Commonly known as:  AUGMENTIN Take 1 tablet by mouth every 12 (twelve) hours for 10 days.   codeine 30 MG tablet Take 1-1.5 tablets (30-45 mg total) by mouth every 6 (six) hours as needed for moderate pain or severe pain.   FLUoxetine 20 MG tablet Commonly known as:  PROZAC TAKE 1 TABLET BY MOUTH ONCE DAILY   ibuprofen 800 MG tablet Commonly known as:  ADVIL,MOTRIN Take 1 tablet (800 mg total) by mouth every 8 (eight) hours as needed.   LORazepam 0.5 MG tablet Commonly known as:  ATIVAN Take 1 tablet (0.5 mg total) by mouth every 8 (eight) hours as needed for anxiety.   medroxyPROGESTERone 5 MG tablet Commonly known as:  PROVERA Take one tablet a day  for 5 days.   MULTIVITAMIN PO Take by mouth daily.   SKYLA 13.5 MG Iud Generic drug:  Levonorgestrel by Intrauterine route. Inserted 07/05/15        Follow-up Information    [facility] Go on 04/10/2017.   Specialty:  General Surgery Why:  Your appointment is at 9:00 AM. Please arrive 30 min prior to appointment time.  Bring photo ID and insurance information.  Contact information: [address] [place] [address] [phone]          Signed: Wells Guiles, [facility] 04/09/2017, 10:14 AM Pager: [phone]  I thought that I had already signed this once. She is doing well, though some what difficult to deal with.  Ovidio Kin, MD, [facility] Pager: [phone] Office phone:  [phone]

## 2017-04-09 NOTE — Progress Notes (Signed)
Dressing changed to left breat wound, old dressing with scant amount of serosanguinous drainage, no odor, parent were in the room with patient, patient took a shower with wound being opened, wound was packed with wet to dry small Kerlix, parent watched RN change dressing, patient was pe medicated with Ativan and Dilaudid PO.

## 2017-04-09 NOTE — Progress Notes (Signed)
Discharge instructions discussed with patient and family, verbalized agreement and understanding 

## 2017-04-09 NOTE — Progress Notes (Signed)
[facility] to see if they were able to [facility] today[facility] Discussed with PA and nurse. Discussed with patient that she would still need a teachable caregiver, she has enlisted a friend as she still does not think her parents would be able. [facility]. Planning for d/c home today. [phone]

## 2017-04-09 NOTE — Discharge Instructions (Signed)
Incision and Drainage, Care After Refer to this sheet in the next few weeks. These instructions provide you with information about caring for yourself after your procedure. Your health care provider may also give you more specific instructions. Your treatment has been planned according to current medical practices, but problems sometimes occur. Call your health care provider if you have any problems or questions after your procedure. What can I expect after the procedure? After the procedure, it is common to have:  Pain or discomfort around your incision site.  Drainage from your incision.  Follow these instructions at home:  Take over-the-counter and prescription medicines only as told by your health care provider.  If you were prescribed an antibiotic medicine, take it as told by your health care provider.Do not stop taking the antibiotic even if you start to feel better.  Followinstructions from your health care provider about: ? How to take care of your incision. ? When and how you should change your packing and bandage (dressing). Wash your hands with soap and water before you change your dressing. If soap and water are not available, use hand sanitizer. ? When you should remove your dressing.  Do not take baths, swim, or use a hot tub until your health care provider approves.  Keep all follow-up visits as told by your health care provider. This is important.  Check your incision area every day for signs of infection. Check for: ? More redness, swelling, or pain. ? More fluid or blood. ? Warmth. ? Pus or a bad smell. Contact a health care provider if:  Your cyst or abscess returns.  You have a fever.  You have more redness, swelling, or pain around your incision.  You have more fluid or blood coming from your incision.  Your incision feels warm to the touch.  You have pus or a bad smell coming from your incision. Get help right away if:  You have severe pain or  bleeding.  You cannot eat or drink without vomiting.  You have decreased urine output.  You become short of breath.  You have chest pain.  You cough up blood.  The area where the incision and drainage occurred becomes numb or it tingles. This information is not intended to replace advice given to you by your health care provider. Make sure you discuss any questions you have with your health care provider. Document Released: 04/23/2011 Document Revised: 07/01/2015 Document Reviewed: 11/19/2014 Elsevier Interactive Patient Education  [employer]2018 Elsevier Inc.  1. PAIN CONTROL:  1. Pain is best controlled by a usual combination of three different methods TOGETHER:  1. Ice/Heat 2. Over the counter pain medication 3. Prescription pain medication 2. Most patients will experience some swelling and bruising around wounds. Ice packs or heating pads (30-60 minutes up to 6 times a day) will help. Use ice for the first few days to help decrease swelling and bruising, then switch to heat to help relax tight/sore spots and speed recovery. Some people prefer to use ice alone, heat alone, alternating between ice & heat. Experiment to what works for you. Swelling and bruising can take several weeks to resolve.  3. It is helpful to take an over-the-counter pain medication regularly for the first few weeks. Choose one of the following that works best for you:  1. Naproxen (Aleve, etc) Two 220mg  tabs twice a day 2. Ibuprofen (Advil, etc) Three 200mg  tabs four times a day (every meal & bedtime) 3. Acetaminophen (Tylenol, etc) 500-650mg  four times a  day (every meal & bedtime) 4. A prescription for pain medication (such as oxycodone, hydrocodone, etc) should be given to you upon discharge. Take your pain medication as prescribed.  1. If you are having problems/concerns with the prescription medicine (does not control pain, nausea, vomiting, rash, itching, etc), please call us [phone] to see if we need to  switch you to a different pain medicine that will work better for you and/or control your side effect better. 2. If you need a refill on your pain medication, please contact your pharmacy. They will contact our office to request authorization. Prescriptions will not be filled after 5 pm or on week-ends. 4. Avoid getting constipated. When taking pain medications, it is common to experience some constipation. Increasing fluid intake and taking a fiber supplement (such as Metamucil, Citrucel, FiberCon, MiraLax, etc) 1-2 times a day regularly will usually help prevent this problem from occurring. A mild laxative (prune juice, Milk of Magnesia, MiraLax, etc) should be taken according to package directions if there are no bowel movements after 48 hours.  5. Watch out for diarrhea. If you have many loose bowel movements, simplify your diet to bland foods & liquids for a few days. Stop any stool softeners and decrease your fiber supplement. Switching to mild anti-diarrheal medications (Kayopectate, Pepto Bismol) can help. If this worsens or does not improve, please call us. 6. Wash / shower every day. You may shower daily and replace your bandges after showering. No bathing or submerging your wounds in water until they heal. 7. FOLLOW UP in our office  1. Please call [facility] at [phone] to set up an appointment for a follow-up appointment approximately 2-3 weeks after discharge for wound check  WHEN TO CALL us [phone]:  1. Poor pain control 2. Reactions / problems with new medications (rash/itching, nausea, etc)  3. Fever over 101.5 F (38.5 C) 4. Worsening swelling or bruising 5. Continued bleeding from wounds. 6. Increased pain, redness, or drainage from the wounds which could be signs of infection  The clinic staff is available to answer your questions during regular business hours (8:30am-5pm). Please dont hesitate to call and ask to speak to one of our nurses for clinical concerns.  If you  have a medical emergency, go to the nearest emergency room or call 911.  A surgeon from [facility] is always on call at the [facility], [place]  [address], [place], [place] [address] ?  MAIN: [phone] ? TOLL FREE: [phone] ?  FAX [phone]  [email or web address]   Prior to office visit for dressing change: - shower with warm water and remove packing, cover with outer dressing  - take tylenol 1000 mg, ibuprofen 800 mg, and 30 mg codeine about 30 min prior to office visit - SOMEONE ELSE WILL HAVE TO DRIVE YOU TO THE OFFICE. DO NOT DRIVE AFTER TAKING NARCOTIC PAIN MEDICATION  - plan for your parents to come to office appointments with you for continued wound care teaching - bring wound care supplies with you (likely we will use our own supplies, but just in case) - after dressing change you can take another 15 mg of codeine if needed, can take another 500 mg of tylenol (as long as you are not exceeding 4000 mg of tylenol in a 24 hr period - You may also try applying ice/heat to the wound 20 min prior to dressing change as tolerated - the cold may eventually  become less painful

## 2017-04-10 DIAGNOSIS — Z4801 Encounter for change or removal of surgical wound dressing: Secondary | ICD-10-CM | POA: Diagnosis not present

## 2017-04-10 DIAGNOSIS — N611 Abscess of the breast and nipple: Secondary | ICD-10-CM | POA: Diagnosis not present

## 2017-04-10 DIAGNOSIS — Z791 Long term (current) use of non-steroidal anti-inflammatories (NSAID): Secondary | ICD-10-CM | POA: Diagnosis not present

## 2017-04-12 ENCOUNTER — Ambulatory Visit: Payer: 59 | Admitting: Psychology

## 2017-04-12 DIAGNOSIS — F331 Major depressive disorder, recurrent, moderate: Secondary | ICD-10-CM

## 2017-04-12 DIAGNOSIS — Z791 Long term (current) use of non-steroidal anti-inflammatories (NSAID): Secondary | ICD-10-CM | POA: Diagnosis not present

## 2017-04-12 DIAGNOSIS — Z4801 Encounter for change or removal of surgical wound dressing: Secondary | ICD-10-CM | POA: Diagnosis not present

## 2017-04-12 DIAGNOSIS — N611 Abscess of the breast and nipple: Secondary | ICD-10-CM | POA: Diagnosis not present

## 2017-04-12 DIAGNOSIS — F411 Generalized anxiety disorder: Secondary | ICD-10-CM | POA: Diagnosis not present

## 2017-04-18 DIAGNOSIS — N611 Abscess of the breast and nipple: Secondary | ICD-10-CM | POA: Diagnosis not present

## 2017-04-18 DIAGNOSIS — Z791 Long term (current) use of non-steroidal anti-inflammatories (NSAID): Secondary | ICD-10-CM | POA: Diagnosis not present

## 2017-04-18 DIAGNOSIS — Z4801 Encounter for change or removal of surgical wound dressing: Secondary | ICD-10-CM | POA: Diagnosis not present

## 2017-04-24 DIAGNOSIS — Z4801 Encounter for change or removal of surgical wound dressing: Secondary | ICD-10-CM | POA: Diagnosis not present

## 2017-04-24 DIAGNOSIS — N611 Abscess of the breast and nipple: Secondary | ICD-10-CM | POA: Diagnosis not present

## 2017-04-24 DIAGNOSIS — Z791 Long term (current) use of non-steroidal anti-inflammatories (NSAID): Secondary | ICD-10-CM | POA: Diagnosis not present

## 2017-04-26 ENCOUNTER — Ambulatory Visit: Payer: 59 | Admitting: Psychology

## 2017-04-26 DIAGNOSIS — F331 Major depressive disorder, recurrent, moderate: Secondary | ICD-10-CM | POA: Diagnosis not present

## 2017-05-10 ENCOUNTER — Ambulatory Visit: Payer: 59 | Admitting: Psychology

## 2017-05-17 ENCOUNTER — Ambulatory Visit: Payer: 59 | Admitting: Psychology

## 2017-05-17 DIAGNOSIS — F331 Major depressive disorder, recurrent, moderate: Secondary | ICD-10-CM

## 2017-05-24 ENCOUNTER — Ambulatory Visit: Payer: 59 | Admitting: Psychology

## 2017-05-24 DIAGNOSIS — F331 Major depressive disorder, recurrent, moderate: Secondary | ICD-10-CM

## 2017-05-29 ENCOUNTER — Ambulatory Visit: Payer: 59 | Admitting: Psychology

## 2017-06-12 ENCOUNTER — Ambulatory Visit: Payer: 59 | Admitting: Psychology

## 2017-06-12 DIAGNOSIS — F331 Major depressive disorder, recurrent, moderate: Secondary | ICD-10-CM | POA: Diagnosis not present

## 2017-06-28 ENCOUNTER — Ambulatory Visit: Payer: 59 | Admitting: Psychology

## 2017-06-28 DIAGNOSIS — F331 Major depressive disorder, recurrent, moderate: Secondary | ICD-10-CM

## 2017-07-03 ENCOUNTER — Ambulatory Visit: Payer: 59 | Admitting: Nurse Practitioner

## 2017-07-03 ENCOUNTER — Other Ambulatory Visit: Payer: Self-pay

## 2017-07-03 ENCOUNTER — Encounter: Payer: Self-pay | Admitting: Obstetrics and Gynecology

## 2017-07-03 ENCOUNTER — Other Ambulatory Visit (HOSPITAL_COMMUNITY)
Admission: RE | Admit: 2017-07-03 | Discharge: 2017-07-03 | Disposition: A | Discharge status: Home / Self Care | Payer: 59 | Source: Ambulatory Visit | Attending: Obstetrics and Gynecology | Admitting: Obstetrics and Gynecology

## 2017-07-03 ENCOUNTER — Ambulatory Visit: Payer: 59 | Admitting: Obstetrics and Gynecology

## 2017-07-03 VITALS — HR 64 | Resp 16 | Ht 67.75 in | Wt 215.0 lb

## 2017-07-03 DIAGNOSIS — Z0000 Encounter for general adult medical examination without abnormal findings: Secondary | ICD-10-CM

## 2017-07-03 DIAGNOSIS — E229 Hyperfunction of pituitary gland, unspecified: Secondary | ICD-10-CM

## 2017-07-03 DIAGNOSIS — Z124 Encounter for screening for malignant neoplasm of cervix: Secondary | ICD-10-CM | POA: Insufficient documentation

## 2017-07-03 DIAGNOSIS — Z01419 Encounter for gynecological examination (general) (routine) without abnormal findings: Secondary | ICD-10-CM | POA: Diagnosis not present

## 2017-07-03 DIAGNOSIS — Z6832 Body mass index (BMI) 32.0-32.9, adult: Secondary | ICD-10-CM

## 2017-07-03 DIAGNOSIS — R7989 Other specified abnormal findings of blood chemistry: Secondary | ICD-10-CM

## 2017-07-03 NOTE — Progress Notes (Addendum)
  Review of Systems  Constitutional: Negative.   HENT: Negative.   Eyes: Negative.   Respiratory: Negative.   Cardiovascular: Negative.   Gastrointestinal: Negative.   Endocrine: Negative.   Genitourinary:       Left breast tenderness   Musculoskeletal: Negative.   Skin: Negative.   Allergic/Immunologic: Negative.   Neurological: Negative.   Psychiatric/Behavioral: Negative.

## 2017-07-03 NOTE — Progress Notes (Signed)
27 y.o. G0P0000 SingleCaucasianF here for annual exam.   She has a skyla IUD, placed in 5/17. Previously had elevated BP on OCP's. She was having monthly cycles with the skyla until she started having health issues about 6 months ago. Then she stopped getting her cycle for several months. In the last few months she has been getting monthly spotting for a few days. One episode of intermenstrual spotting as well (felt like she was ovulating).   She had a mildly elevated prolactin level in 2/19 for amenorrhea, needs repeat.  She has had bilateral breast abscesses in the last 6 months.  Never sexually active, broke up with her boyfriend.   Period Duration (Days): 2-3 days  Period Pattern: (!) Irregular Menstrual Flow: Moderate, Light Menstrual Control: Thin pad Menstrual Control Change Freq (Hours): changes pad every 5 hours  Dysmenorrhea: (!) Mild Dysmenorrhea Symptoms: Cramping  Patient's last menstrual period was 06/16/2017.          Sexually active: Never  The current method of family planning is IUD.    Exercising: Yes.    spin class  Smoker:  no  Health Maintenance: Pap: 03-30-14 WNL  History of abnormal Pap:  no MMG:   04-01-17 breast abscess Colonoscopy:  Never BMD:   Never TDaP:  07-26-10 Gardasil: completed all 3    reports that she has never smoked. She has never used smokeless tobacco. She reports that she drinks about 0.6 - 1.2 oz of alcohol per week. She reports that she does not use drugs. She has one more semester of college. Pre-med.  Past Medical History:  Diagnosis Date  . Anemia   . Anxiety   . Depression   . IBS (irritable bowel syndrome)     Past Surgical History:  Procedure Laterality Date  . BREAST SURGERY     breast abscess   . EVACUATION BREAST HEMATOMA Left 04/05/2017   Procedure: IRRIGATION AND DEBRIDMENT LEFT  BREAST ABCESS;  Surgeon: Romie Levee, MD;  Location: [facility];  Service: General;  Laterality: Left;  . INTRAUTERINE DEVICE (IUD)  INSERTION  07/05/15   Skyla   . TONSILLECTOMY AND ADENOIDECTOMY  01/2017    Current Outpatient Medications  Medication Sig Dispense Refill  . acetaminophen (TYLENOL) 500 MG tablet Take 2 tablets (1,000 mg total) by mouth every 6 (six) hours as needed. 30 tablet 0  . FLUoxetine (PROZAC) 20 MG tablet TAKE 1 TABLET BY MOUTH ONCE DAILY 90 tablet 3  . ibuprofen (ADVIL,MOTRIN) 800 MG tablet Take 1 tablet (800 mg total) by mouth every 8 (eight) hours as needed. 30 tablet 1  . Levonorgestrel (SKYLA) 13.5 MG IUD by Intrauterine route. Inserted 07/05/15    . LORazepam (ATIVAN) 0.5 MG tablet Take 1 tablet (0.5 mg total) by mouth every 8 (eight) hours as needed for anxiety. 90 tablet 2  . Multiple Vitamins-Minerals (MULTIVITAMIN PO) Take by mouth daily.    . temazepam (RESTORIL) 15 MG capsule Take 15 mg by mouth at bedtime as needed for sleep.     No current facility-administered medications for this visit.     Family History  Problem Relation Age of Onset  . Anemia Mother   . Hypertension Mother   . Diabetes Mellitus II Paternal Grandmother 88  . Breast cancer Paternal Grandmother 74       lumpectomy and 2 months of chemo  . Heart failure Maternal Grandfather   . Heart attack Maternal Grandfather   . Stroke Maternal Grandmother   . Diabetes Paternal  Grandfather   . Depression Unknown   . Melanoma Unknown        PGF  . Hypertension Unknown        Maternal Side   . Stroke Maternal Aunt   . Heart attack Maternal Aunt     Review of Systems  Constitutional: Negative.   HENT: Negative.   Eyes: Negative.   Respiratory: Negative.   Cardiovascular: Negative.   Gastrointestinal: Negative.   Endocrine: Negative.   Genitourinary:       Left breast tenderness   Musculoskeletal: Negative.   Skin: Negative.   Allergic/Immunologic: Negative.   Neurological: Negative.   Psychiatric/Behavioral: Negative.     Exam:   Pulse 64   Resp 16   Ht 5' 7.75" (1.721 m)   Wt 215 lb (97.5 kg)   LMP  06/16/2017   BMI 32.93 kg/m   Weight change: @ Height:   Height: 5' 7.75" (172.1 cm)  Ht Readings from Last 3 Encounters:  07/03/17 5' 7.75" (1.721 m)  04/04/17  (1.727 m)  04/03/17  (1.727 m)    General appearance: alert, cooperative and appears stated age Head: Normocephalic, without obvious abnormality, atraumatic Neck: no adenopathy, supple, symmetrical, trachea midline and thyroid normal to inspection and palpation Lungs: clear to auscultation bilaterally Cardiovascular: regular rate and rhythm Breasts: normal appearance, no masses or tenderness Abdomen: soft, non-tender; non distended,  no masses,  no organomegaly Extremities: extremities normal, atraumatic, no cyanosis or edema Skin: Skin color, texture, turgor normal. No rashes or lesions Lymph nodes: Cervical, supraclavicular, and axillary nodes normal. No abnormal inguinal nodes palpated Neurologic: Grossly normal   Pelvic: External genitalia:  no lesions              Urethra:  normal appearing urethra with no masses, tenderness or lesions              Bartholins and Skenes: normal                 Vagina: normal appearing vagina with normal color and discharge, no lesions              Cervix: no lesions and IUD string 3 cm               Bimanual Exam:  Uterus:  no masses or tenderness              Adnexa: no mass, fullness, tenderness               Rectovaginal: Confirms               Anus:  normal sphincter tone, no lesions  Chaperone was present for exam.  A:  Well Woman with normal exam  IUD check  BMI 32.9  Elevated prolactin  P:   Pap   No STD testing needed   She is starting a weight loss program at [facility]  Screening labs, prolactin, HgbA1C  Discussed breast self exam  Discussed calcium and vit D intake  IUD due to be removed in one year  Use condoms if sexually active

## 2017-07-03 NOTE — Patient Instructions (Signed)
EXERCISE AND DIET:  We recommended that you start or continue a regular exercise program for good health. Regular exercise means any activity that makes your heart beat faster and makes you sweat.  We recommend exercising at least 30 minutes per day at least 3 days a week, preferably 4 or 5.  We also recommend a diet low in fat and sugar.  Inactivity, poor dietary choices and obesity can cause diabetes, heart attack, stroke, and kidney damage, among others.    ALCOHOL AND SMOKING:  Women should limit their alcohol intake to no more than 7 drinks/beers/glasses of wine (combined, not each!) per week. Moderation of alcohol intake to this level decreases your risk of breast cancer and liver damage. And of course, no recreational drugs are part of a healthy lifestyle.  And absolutely no smoking or even second hand smoke. Most people know smoking can cause heart and lung diseases, but did you know it also contributes to weakening of your bones? Aging of your skin?  Yellowing of your teeth and nails?  CALCIUM AND VITAMIN D:  Adequate intake of calcium and Vitamin D are recommended.  The recommendations for exact amounts of these supplements seem to change often, but generally speaking 600 mg of calcium (either carbonate or citrate) and 800 units of Vitamin D per day seems prudent. Certain women may benefit from higher intake of Vitamin D.  If you are among these women, your doctor will have told you during your visit.    PAP SMEARS:  Pap smears, to check for cervical cancer or precancers,  have traditionally been done yearly, although recent scientific advances have shown that most women can have pap smears less often.  However, every woman still should have a physical exam from her gynecologist every year. It will include a breast check, inspection of the vulva and vagina to check for abnormal growths or skin changes, a visual exam of the cervix, and then an exam to evaluate the size and shape of the uterus and  ovaries.  And after 25 years of age, a rectal exam is indicated to check for rectal cancers. We will also provide age appropriate advice regarding health maintenance, like when you should have certain vaccines, screening for sexually transmitted diseases, bone density testing, colonoscopy, mammograms, etc.   MAMMOGRAMS:  All women over 40 years old should have a yearly mammogram. Many facilities now offer a "3D" mammogram, which may cost around $50 extra out of pocket. If possible,  we recommend you accept the option to have the 3D mammogram performed.  It both reduces the number of women who will be called back for extra views which then turn out to be normal, and it is better than the routine mammogram at detecting truly abnormal areas.    COLONOSCOPY:  Colonoscopy to screen for colon cancer is recommended for all women at age 50.  We know, you hate the idea of the prep.  We agree, BUT, having colon cancer and not knowing it is worse!!  Colon cancer so often starts as a polyp that can be seen and removed at colonscopy, which can quite literally save your life!  And if your first colonoscopy is normal and you have no family history of colon cancer, most women don't have to have it again for 10 years.  Once every ten years, you can do something that may end up saving your life, right?  We will be happy to help you get it scheduled when you are ready.    Be sure to check your insurance coverage so you understand how much it will cost.  It may be covered as a preventative service at no cost, but you should check your particular policy.      Breast Self-Awareness Breast self-awareness means being familiar with how your breasts look and feel. It involves checking your breasts regularly and reporting any changes to your health care provider. Practicing breast self-awareness is important. A change in your breasts can be a sign of a serious medical problem. Being familiar with how your breasts look and feel allows  you to find any problems early, when treatment is more likely to be successful. All women should practice breast self-awareness, including women who have had breast implants. How to do a breast self-exam One way to learn what is normal for your breasts and whether your breasts are changing is to do a breast self-exam. To do a breast self-exam: Look for Changes  1. Remove all the clothing above your waist. 2. Stand in front of a mirror in a room with good lighting. 3. Put your hands on your hips. 4. Push your hands firmly downward. 5. Compare your breasts in the mirror. Look for differences between them (asymmetry), such as: ? Differences in shape. ? Differences in size. ? Puckers, dips, and bumps in one breast and not the other. 6. Look at each breast for changes in your skin, such as: ? Redness. ? Scaly areas. 7. Look for changes in your nipples, such as: ? Discharge. ? Bleeding. ? Dimpling. ? Redness. ? A change in position. Feel for Changes  Carefully feel your breasts for lumps and changes. It is best to do this while lying on your back on the floor and again while sitting or standing in the shower or tub with soapy water on your skin. Feel each breast in the following way:  Place the arm on the side of the breast you are examining above your head.  Feel your breast with the other hand.  Start in the nipple area and make  inch (2 cm) overlapping circles to feel your breast. Use the pads of your three middle fingers to do this. Apply light pressure, then medium pressure, then firm pressure. The light pressure will allow you to feel the tissue closest to the skin. The medium pressure will allow you to feel the tissue that is a little deeper. The firm pressure will allow you to feel the tissue close to the ribs.  Continue the overlapping circles, moving downward over the breast until you feel your ribs below your breast.  Move one finger-width toward the center of the body.  Continue to use the  inch (2 cm) overlapping circles to feel your breast as you move slowly up toward your collarbone.  Continue the up and down exam using all three pressures until you reach your armpit.  Write Down What You Find  Write down what is normal for each breast and any changes that you find. Keep a written record with breast changes or normal findings for each breast. By writing this information down, you do not need to depend only on memory for size, tenderness, or location. Write down where you are in your menstrual cycle, if you are still menstruating. If you are having trouble noticing differences in your breasts, do not get discouraged. With time you will become more familiar with the variations in your breasts and more comfortable with the exam. How often should I examine my breasts? Examine   your breasts every month. If you are breastfeeding, the best time to examine your breasts is after a feeding or after using a breast pump. If you menstruate, the best time to examine your breasts is 5-7 days after your period is over. During your period, your breasts are lumpier, and it may be more difficult to notice changes. When should I see my health care provider? See your health care provider if you notice:  A change in shape or size of your breasts or nipples.  A change in the skin of your breast or nipples, such as a reddened or scaly area.  Unusual discharge from your nipples.  A lump or thick area that was not there before.  Pain in your breasts.  Anything that concerns you.  This information is not intended to replace advice given to you by your health care provider. Make sure you discuss any questions you have with your health care provider. Document Released: 01/29/2005 Document Revised: 07/07/2015 Document Reviewed: 12/19/2014 Elsevier Interactive Patient Education  2018 [employer] Inc.  

## 2017-07-04 LAB — LIPID PANEL
Chol/HDL Ratio: 4 ratio (ref 0.0–4.4)
Cholesterol, Total: 168 mg/dL (ref 100–199)
HDL: 42 mg/dL (ref >39)
LDL Calculated: 88 mg/dL (ref 0–99)
Triglycerides: 189 mg/dL — ABNORMAL HIGH (ref 0–149)
VLDL Cholesterol Cal: 38 mg/dL (ref 5–40)

## 2017-07-04 LAB — CBC
HEMOGLOBIN: 13.3 g/dL (ref 11.1–15.9)
Hematocrit: 43.1 % (ref 34.0–46.6)
MCH: 26.6 pg (ref 26.6–33.0)
MCHC: 30.9 g/dL — AB (ref 31.5–35.7)
MCV: 86 fL (ref 79–97)
Platelets: 273 10*3/uL (ref 150–450)
RBC: 5 x10E6/uL (ref 3.77–5.28)
RDW: 14 % (ref 12.3–15.4)
WBC: 8.7 10*3/uL (ref 3.4–10.8)

## 2017-07-04 LAB — COMPREHENSIVE METABOLIC PANEL
ALBUMIN: 4.4 g/dL (ref 3.5–5.5)
ALK PHOS: 84 IU/L (ref 39–117)
ALT: 21 IU/L (ref 0–32)
AST: 15 IU/L (ref 0–40)
Albumin/Globulin Ratio: 1.5 (ref 1.2–2.2)
BUN/Creatinine Ratio: 22 (ref 9–23)
BUN: 13 mg/dL (ref 6–20)
Bilirubin Total: 0.7 mg/dL (ref 0.0–1.2)
CHLORIDE: 103 mmol/L (ref 96–106)
CO2: 23 mmol/L (ref 20–29)
Calcium: 9.4 mg/dL (ref 8.7–10.2)
Creatinine, Ser: 0.59 mg/dL (ref 0.57–1.00)
GFR calc non Af Amer: 129 mL/min/{1.73_m2} (ref >59)
GFR, EST AFRICAN AMERICAN: 148 mL/min/{1.73_m2} (ref >59)
GLUCOSE: 100 mg/dL — AB (ref 65–99)
Globulin, Total: 3 g/dL (ref 1.5–4.5)
POTASSIUM: 4.4 mmol/L (ref 3.5–5.2)
Sodium: 141 mmol/L (ref 134–144)
Total Protein: 7.4 g/dL (ref 6.0–8.5)

## 2017-07-04 LAB — HEMOGLOBIN A1C
Est. average glucose Bld gHb Est-mCnc: 100 mg/dL
HEMOGLOBIN A1C: 5.1 % (ref 4.8–5.6)

## 2017-07-04 LAB — CYTOLOGY - PAP: DIAGNOSIS: NEGATIVE

## 2017-07-04 LAB — PROLACTIN: PROLACTIN: 16.3 ng/mL (ref 4.8–23.3)

## 2017-07-10 ENCOUNTER — Ambulatory Visit: Payer: 59 | Admitting: Psychology

## 2017-07-10 DIAGNOSIS — F331 Major depressive disorder, recurrent, moderate: Secondary | ICD-10-CM

## 2017-07-24 ENCOUNTER — Ambulatory Visit (INDEPENDENT_AMBULATORY_CARE_PROVIDER_SITE_OTHER): Payer: 59 | Admitting: Psychology

## 2017-07-24 DIAGNOSIS — F331 Major depressive disorder, recurrent, moderate: Secondary | ICD-10-CM | POA: Diagnosis not present

## 2017-08-08 ENCOUNTER — Ambulatory Visit (INDEPENDENT_AMBULATORY_CARE_PROVIDER_SITE_OTHER): Payer: 59 | Admitting: Psychology

## 2017-08-08 DIAGNOSIS — F3132 Bipolar disorder, current episode depressed, moderate: Secondary | ICD-10-CM | POA: Diagnosis not present

## 2017-08-21 ENCOUNTER — Ambulatory Visit (INDEPENDENT_AMBULATORY_CARE_PROVIDER_SITE_OTHER): Payer: 59 | Admitting: Psychology

## 2017-08-21 DIAGNOSIS — F331 Major depressive disorder, recurrent, moderate: Secondary | ICD-10-CM | POA: Diagnosis not present

## 2017-09-04 ENCOUNTER — Ambulatory Visit (INDEPENDENT_AMBULATORY_CARE_PROVIDER_SITE_OTHER): Payer: 59 | Admitting: Psychology

## 2017-09-04 DIAGNOSIS — F331 Major depressive disorder, recurrent, moderate: Secondary | ICD-10-CM

## 2017-09-10 ENCOUNTER — Other Ambulatory Visit: Payer: Self-pay | Admitting: Family Medicine

## 2017-09-10 ENCOUNTER — Other Ambulatory Visit: Payer: Self-pay

## 2017-10-03 ENCOUNTER — Ambulatory Visit (INDEPENDENT_AMBULATORY_CARE_PROVIDER_SITE_OTHER): Payer: 59 | Admitting: Psychology

## 2017-10-03 DIAGNOSIS — F331 Major depressive disorder, recurrent, moderate: Secondary | ICD-10-CM

## 2017-10-17 ENCOUNTER — Ambulatory Visit (INDEPENDENT_AMBULATORY_CARE_PROVIDER_SITE_OTHER): Payer: 59 | Admitting: Psychology

## 2017-10-17 DIAGNOSIS — F331 Major depressive disorder, recurrent, moderate: Secondary | ICD-10-CM | POA: Diagnosis not present

## 2017-10-21 ENCOUNTER — Other Ambulatory Visit: Payer: Self-pay | Admitting: Family Medicine

## 2017-10-21 NOTE — Telephone Encounter (Signed)
Last OV  03/22/2017  Sent to PCP to advise 03/22/2017 disp 60 with 5 refills   Sent to PCP for approval

## 2017-10-22 NOTE — Telephone Encounter (Signed)
Call in #[phone] with 5 rf 

## 2017-10-28 ENCOUNTER — Telehealth: Payer: Self-pay

## 2017-10-28 NOTE — Telephone Encounter (Signed)
Copied from CRM [id number]. Topic: General - Other >> Oct 28, 2017  4:26 PM Mcneil, Ja-Kwan wrote: Reason for CRM: Pt states she has been experiencing anxiety and panic attacks and she would like to speak with Dr Clent RidgesFry about her medications FLUoxetine (PROZAC) 20 MG capsule and LORazepam (ATIVAN) 0.5 MG tablet. Pt stated she may need a possible change in the medications or an increase in the dosage. Pt requests call back. Cb# [phone]

## 2017-10-29 NOTE — Telephone Encounter (Signed)
Dr. Fry please advise. Thanks  

## 2017-10-29 NOTE — Telephone Encounter (Signed)
Increase the Prozac to 40 mg daily. Call in #30 with 2 rf

## 2017-10-29 NOTE — Telephone Encounter (Signed)
Called and spoke with pt and she is aware of the increase in the prozac from 20 mg daily to 40 mg daily.  She stated that she has enough for another month and she will just take these to see how she does.  She is aware to call back for refill and the medication list has been update.

## 2017-11-01 ENCOUNTER — Ambulatory Visit (INDEPENDENT_AMBULATORY_CARE_PROVIDER_SITE_OTHER): Payer: 59 | Admitting: Psychology

## 2017-11-01 DIAGNOSIS — F331 Major depressive disorder, recurrent, moderate: Secondary | ICD-10-CM

## 2017-11-14 ENCOUNTER — Ambulatory Visit (INDEPENDENT_AMBULATORY_CARE_PROVIDER_SITE_OTHER): Payer: 59 | Admitting: Psychology

## 2017-11-14 DIAGNOSIS — F331 Major depressive disorder, recurrent, moderate: Secondary | ICD-10-CM

## 2017-11-15 ENCOUNTER — Ambulatory Visit (INDEPENDENT_AMBULATORY_CARE_PROVIDER_SITE_OTHER): Payer: 59 | Admitting: Family Medicine

## 2017-11-15 ENCOUNTER — Other Ambulatory Visit: Payer: Self-pay | Admitting: Family Medicine

## 2017-11-15 ENCOUNTER — Encounter: Payer: Self-pay | Admitting: Family Medicine

## 2017-11-15 VITALS — HR 98 | Temp 98.4°F | Wt 223.4 lb

## 2017-11-15 DIAGNOSIS — F418 Other specified anxiety disorders: Secondary | ICD-10-CM | POA: Diagnosis not present

## 2017-11-15 MED ORDER — VENLAFAXINE HCL ER 75 MG PO CP24: 75.0000 mg | ORAL_CAPSULE | Freq: Every day | ORAL | 2 refills | Status: DC

## 2017-11-15 NOTE — Progress Notes (Signed)
   Subjective:    Patient ID: April Rivas, female    DOB: 1991/01/06, 27 y.o.   MRN: [id number]  HPI Here to discuss her anxiety and depression. She has been taking Prozac 40 mg daily for some months now and she had been doing very well. However over the past month she has been dealing with a lot more anxiety than usual. Her appetite is down and she has trouble sleeping. She thinks the Prozac is making her feel fatigued, so she does not want to increase the dose of this. She has tried Zoloft in the past but she did not tolerate that well.    Review of Systems  Constitutional: Positive for fatigue.  Respiratory: Negative.   Cardiovascular: Negative.   Neurological: Negative.   Psychiatric/Behavioral: Positive for dysphoric mood and sleep disturbance. Negative for agitation, behavioral problems, confusion and hallucinations. The patient is nervous/anxious.        Objective:   Physical Exam  Constitutional: She is oriented to person, place, and time. She appears well-developed and well-nourished.  Cardiovascular: Normal rate, regular rhythm, normal heart sounds and intact distal pulses.  Pulmonary/Chest: Effort normal and breath sounds normal.  Neurological: She is alert and oriented to person, place, and time.  Psychiatric: Her behavior is normal. Thought content normal.  Somewhat anxious           Assessment & Plan:  Depression with anxiety, we will stop Prozac and try Effexor XR 75 mg daily. Recheck in 3-4 weeks. Gershon Crane, MD

## 2017-11-16 NOTE — Telephone Encounter (Signed)
Dr. Fry please advise of refill. Thanks 

## 2017-11-18 NOTE — Telephone Encounter (Signed)
Done

## 2017-11-28 ENCOUNTER — Ambulatory Visit (INDEPENDENT_AMBULATORY_CARE_PROVIDER_SITE_OTHER): Payer: 59 | Admitting: Psychology

## 2017-11-28 DIAGNOSIS — F331 Major depressive disorder, recurrent, moderate: Secondary | ICD-10-CM | POA: Diagnosis not present

## 2017-12-07 ENCOUNTER — Other Ambulatory Visit: Payer: Self-pay | Admitting: Family Medicine

## 2017-12-12 ENCOUNTER — Ambulatory Visit (INDEPENDENT_AMBULATORY_CARE_PROVIDER_SITE_OTHER): Payer: 59 | Admitting: Psychology

## 2017-12-12 DIAGNOSIS — F331 Major depressive disorder, recurrent, moderate: Secondary | ICD-10-CM

## 2017-12-26 ENCOUNTER — Ambulatory Visit: Payer: 59 | Admitting: Psychology

## 2018-01-06 ENCOUNTER — Ambulatory Visit: Payer: 59 | Admitting: Psychology

## 2018-01-22 ENCOUNTER — Ambulatory Visit (INDEPENDENT_AMBULATORY_CARE_PROVIDER_SITE_OTHER): Payer: 59 | Admitting: Psychology

## 2018-01-22 DIAGNOSIS — F331 Major depressive disorder, recurrent, moderate: Secondary | ICD-10-CM | POA: Diagnosis not present

## 2018-01-22 DIAGNOSIS — F411 Generalized anxiety disorder: Secondary | ICD-10-CM

## 2018-02-07 ENCOUNTER — Ambulatory Visit (INDEPENDENT_AMBULATORY_CARE_PROVIDER_SITE_OTHER): Payer: 59 | Admitting: Psychology

## 2018-02-07 DIAGNOSIS — F331 Major depressive disorder, recurrent, moderate: Secondary | ICD-10-CM

## 2018-02-07 DIAGNOSIS — F411 Generalized anxiety disorder: Secondary | ICD-10-CM | POA: Diagnosis not present

## 2018-02-16 ENCOUNTER — Other Ambulatory Visit: Payer: Self-pay | Admitting: Family Medicine

## 2018-02-20 ENCOUNTER — Encounter: Payer: Self-pay | Admitting: Family Medicine

## 2018-02-20 ENCOUNTER — Ambulatory Visit (INDEPENDENT_AMBULATORY_CARE_PROVIDER_SITE_OTHER): Payer: 59 | Admitting: Psychology

## 2018-02-20 DIAGNOSIS — F411 Generalized anxiety disorder: Secondary | ICD-10-CM

## 2018-02-20 DIAGNOSIS — F331 Major depressive disorder, recurrent, moderate: Secondary | ICD-10-CM

## 2018-02-20 NOTE — Telephone Encounter (Signed)
Dr. Clent Ridges please advise on refill or do you need to see her first?  thanks

## 2018-03-06 ENCOUNTER — Ambulatory Visit: Payer: 59 | Admitting: Psychology

## 2018-03-06 DIAGNOSIS — F331 Major depressive disorder, recurrent, moderate: Secondary | ICD-10-CM | POA: Diagnosis not present

## 2018-03-20 ENCOUNTER — Ambulatory Visit (INDEPENDENT_AMBULATORY_CARE_PROVIDER_SITE_OTHER): Payer: 59 | Admitting: Psychology

## 2018-03-20 DIAGNOSIS — F331 Major depressive disorder, recurrent, moderate: Secondary | ICD-10-CM | POA: Diagnosis not present

## 2018-04-03 ENCOUNTER — Ambulatory Visit (INDEPENDENT_AMBULATORY_CARE_PROVIDER_SITE_OTHER): Payer: 59 | Admitting: Psychology

## 2018-04-03 DIAGNOSIS — F411 Generalized anxiety disorder: Secondary | ICD-10-CM

## 2018-04-03 DIAGNOSIS — F331 Major depressive disorder, recurrent, moderate: Secondary | ICD-10-CM | POA: Diagnosis not present

## 2018-04-17 ENCOUNTER — Ambulatory Visit (INDEPENDENT_AMBULATORY_CARE_PROVIDER_SITE_OTHER): Payer: 59 | Admitting: Psychology

## 2018-04-17 DIAGNOSIS — F331 Major depressive disorder, recurrent, moderate: Secondary | ICD-10-CM | POA: Diagnosis not present

## 2018-04-29 ENCOUNTER — Ambulatory Visit: Payer: 59 | Admitting: Psychology

## 2018-05-01 ENCOUNTER — Ambulatory Visit: Payer: 59 | Admitting: Psychology

## 2018-05-15 ENCOUNTER — Ambulatory Visit: Payer: 59 | Admitting: Psychology

## 2018-05-29 ENCOUNTER — Ambulatory Visit: Payer: 59 | Admitting: Psychology

## 2018-06-12 ENCOUNTER — Ambulatory Visit: Payer: 59 | Admitting: Psychology

## 2018-06-18 ENCOUNTER — Other Ambulatory Visit: Payer: Self-pay | Admitting: *Deleted

## 2018-06-18 MED ORDER — VENLAFAXINE HCL ER 75 MG PO CP24: ORAL_CAPSULE | ORAL | 1 refills | Status: DC

## 2018-06-30 ENCOUNTER — Telehealth: Payer: Self-pay | Admitting: Obstetrics and Gynecology

## 2018-06-30 DIAGNOSIS — Z30433 Encounter for removal and reinsertion of intrauterine contraceptive device: Secondary | ICD-10-CM

## 2018-06-30 NOTE — Telephone Encounter (Signed)
Patient calling to schedule iud removal and insertion.

## 2018-06-30 NOTE — Telephone Encounter (Signed)
Spoke with patient. Patient has a Skyla IUD that was placed 07/05/2015. Would like to schedule removal and Kyleena insertion. Appointment scheduled for 07/03/2018 at 4:30 pm with Dr.Jertson. Patient is agreeable to date and time. Pre procedure instructions given.  Motrin instructions given. Motrin=Advil=Ibuprofen, 800 mg one hour before appointment. Eat a meal and hydrate well before appointment. Order placed.   Routing to provider and will close encounter.

## 2018-07-03 ENCOUNTER — Other Ambulatory Visit: Payer: Self-pay

## 2018-07-03 ENCOUNTER — Ambulatory Visit (INDEPENDENT_AMBULATORY_CARE_PROVIDER_SITE_OTHER): Payer: 59 | Admitting: Obstetrics and Gynecology

## 2018-07-03 ENCOUNTER — Encounter: Payer: Self-pay | Admitting: Obstetrics and Gynecology

## 2018-07-03 VITALS — BP 128/86 | HR 100 | Temp 98.1°F | Wt 224.2 lb

## 2018-07-03 DIAGNOSIS — Z113 Encounter for screening for infections with a predominantly sexual mode of transmission: Secondary | ICD-10-CM

## 2018-07-03 DIAGNOSIS — Z01812 Encounter for preprocedural laboratory examination: Secondary | ICD-10-CM | POA: Diagnosis not present

## 2018-07-03 DIAGNOSIS — Z30433 Encounter for removal and reinsertion of intrauterine contraceptive device: Secondary | ICD-10-CM

## 2018-07-03 NOTE — Progress Notes (Signed)
GYNECOLOGY  VISIT   HPI: 28 y.o.   Single White or Caucasian Hispanic or Latino  female   G0P0000 with Patient's last menstrual period was 06/04/2018 (exact date).   here for Skyla removal and Kyleena insertion.     GYNECOLOGIC HISTORY: Patient's last menstrual period was 06/04/2018 (exact date). Contraception: Skyla Menopausal hormone therapy: None       OB History    Gravida  0   Para  0   Term  0   Preterm  0   AB  0   Living  0     SAB  0   TAB  0   Ectopic  0   Multiple  0   Live Births  0              Patient Active Problem List   Diagnosis Date Noted  . Left breast abscess 04/04/2017  . Recurrent tonsillitis 06/11/2016  . Eczema 09/14/2015  . Depression with anxiety 03/01/2014  . ANKLE PAIN, LEFT 07/20/2009  . ROTATOR CUFF INJURY, LEFT SHOULDER 07/20/2009  . OTHER ACQUIRED DEFORMITY OF ANKLE AND FOOT OTHER 07/20/2009  . AMENORRHEA, PRIMARY 09/10/2008  . CERVICAL LYMPHADENOPATHY 09/10/2008    Past Medical History:  Diagnosis Date  . Anemia   . Anxiety   . Depression   . IBS (irritable bowel syndrome)     Past Surgical History:  Procedure Laterality Date  . BREAST SURGERY     breast abscess   . EVACUATION BREAST HEMATOMA Left 04/05/2017   Procedure: IRRIGATION AND DEBRIDMENT LEFT  BREAST ABCESS;  Surgeon: Romie Levee, MD;  Location: [facility];  Service: General;  Laterality: Left;  . INTRAUTERINE DEVICE (IUD) INSERTION  07/05/15   Skyla   . TONSILLECTOMY AND ADENOIDECTOMY  01/2017    Current Outpatient Medications  Medication Sig Dispense Refill  . acetaminophen (TYLENOL) 500 MG tablet Take 2 tablets (1,000 mg total) by mouth every 6 (six) hours as needed. 30 tablet 0  . CRANBERRY PO Take by mouth.    Marland Kitchen ibuprofen (ADVIL,MOTRIN) 800 MG tablet Take 1 tablet (800 mg total) by mouth every 8 (eight) hours as needed. 30 tablet 1  . Levonorgestrel (SKYLA) 13.5 MG IUD by Intrauterine route. Inserted 07/05/15    . LORazepam (ATIVAN) 0.5 MG tablet  TAKE 1 TABLET EVERY 8 HOURS AS NEEDED FOR ANXIETY 90 tablet 2  . Multiple Vitamins-Minerals (MULTIVITAMIN PO) Take by mouth daily.    Marland Kitchen venlafaxine XR (EFFEXOR-XR) 75 MG 24 hr capsule TAKE 1 CAPSULE BY MOUTH EVERY DAY WITH BREAKFAST 90 capsule 1   No current facility-administered medications for this visit.      ALLERGIES: Benzoyl peroxide  Family History  Problem Relation Age of Onset  . Anemia Mother   . Hypertension Mother   . Diabetes Mellitus II Paternal Grandmother 76  . Breast cancer Paternal Grandmother 64       lumpectomy and 2 months of chemo  . Heart failure Maternal Grandfather   . Heart attack Maternal Grandfather   . Stroke Maternal Grandmother   . Diabetes Paternal Grandfather   . Depression Other   . Melanoma Other        PGF  . Hypertension Other        Maternal Side   . Stroke Maternal Aunt   . Heart attack Maternal Aunt     Social History   Socioeconomic History  . Marital status: Single    Spouse name: Not on file  . Number  of children: Not on file  . Years of education: Not on file  . Highest education level: Not on file  Occupational History  . Not on file  Social Needs  . Financial resource strain: Not on file  . Food insecurity:    Worry: Not on file    Inability: Not on file  . Transportation needs:    Medical: Not on file    Non-medical: Not on file  Tobacco Use  . Smoking status: Never Smoker  . Smokeless tobacco: Never Used  Substance and Sexual Activity  . Alcohol use: Yes    Comment: socially  . Drug use: No  . Sexual activity: Not Currently    Partners: Male    Birth control/protection: I.U.D.    Comment: Insertion 07/05/15  Lifestyle  . Physical activity:    Days per week: Not on file    Minutes per session: Not on file  . Stress: Not on file  Relationships  . Social connections:    Talks on phone: Not on file    Gets together: Not on file    Attends religious service: Not on file    Active member of club or  organization: Not on file    Attends meetings of clubs or organizations: Not on file    Relationship status: Not on file  . Intimate partner violence:    Fear of current or ex partner: Not on file    Emotionally abused: Not on file    Physically abused: Not on file    Forced sexual activity: Not on file  Other Topics Concern  . Not on file  Social History Narrative  . Not on file    Review of Systems  Constitutional: Negative.   HENT: Negative.   Eyes: Negative.   Respiratory: Negative.   Cardiovascular: Negative.   Gastrointestinal: Negative.   Genitourinary: Negative.   Musculoskeletal: Negative.   Skin: Negative.   Neurological: Negative.   Endo/Heme/Allergies: Negative.   Psychiatric/Behavioral: Negative.     PHYSICAL EXAMINATION:    BP 140/90 (BP Location: Right Arm, Patient Position: Sitting, Cuff Size: Large)   Pulse 100   Temp 98.1 F (36.7 C) (Skin)   Wt 224 lb 3.2 oz (101.7 kg)   LMP 06/04/2018 (Exact Date)   BMI 34.34 kg/m     General appearance: alert, cooperative and appears stated age  Pelvic: External genitalia:  no lesions              Urethra:  normal appearing urethra with no masses, tenderness or lesions              Bartholins and Skenes: normal                 Vagina: normal appearing vagina with normal color and discharge, no lesions              Cervix: no lesions                The risks of the kyleena IUD were reviewed with the patient, including infection, abnormal bleeding and uterine perfortion. Consent was signed.  A speculum was placed in the vagina, the skyla IUD was removed. The cervix was cleansed with betadine. A tenaculum was placed on the cervix, the uterus sounded to 7 cm. The cervix was dilated with the os finder  The kyleena IUD was inserted without difficulty. The string were cut to 3-4 cm. The tenaculum was removed. Slight oozing from the tenaculum site was  stopped with pressure.   The patient tolerated the procedure  well.   Chaperone was present for exam.  ASSESSMENT Contraception, discussed the different IUD's, she wants to proceed with the kyleena    PLAN Genprobe done Skyla removed, kyleena placed F/U for an annual and IUD check in 4 weeks   An After Visit Summary was printed and given to the patient.

## 2018-07-03 NOTE — Patient Instructions (Signed)

## 2018-07-08 LAB — GC/CHLAMYDIA PROBE AMP
Chlamydia trachomatis, NAA: NEGATIVE
Neisseria Gonorrhoeae by PCR: NEGATIVE

## 2018-07-10 ENCOUNTER — Ambulatory Visit (INDEPENDENT_AMBULATORY_CARE_PROVIDER_SITE_OTHER): Payer: 59 | Admitting: Psychology

## 2018-07-10 DIAGNOSIS — F331 Major depressive disorder, recurrent, moderate: Secondary | ICD-10-CM | POA: Diagnosis not present

## 2018-07-15 ENCOUNTER — Other Ambulatory Visit: Payer: Self-pay | Admitting: Family Medicine

## 2018-07-17 ENCOUNTER — Other Ambulatory Visit: Payer: Self-pay

## 2018-07-18 NOTE — Telephone Encounter (Signed)
Please advise 

## 2018-07-18 NOTE — Telephone Encounter (Signed)
Spoke with pharmacy and it has been filled

## 2018-07-18 NOTE — Telephone Encounter (Signed)
Call in #[phone] with 2 rf 

## 2018-07-21 ENCOUNTER — Ambulatory Visit: Payer: 59 | Admitting: Obstetrics and Gynecology

## 2018-07-21 ENCOUNTER — Encounter: Payer: Self-pay | Admitting: Obstetrics and Gynecology

## 2018-07-21 ENCOUNTER — Other Ambulatory Visit: Payer: Self-pay

## 2018-07-21 VITALS — HR 84 | Temp 98.3°F | Ht 68.0 in | Wt 222.4 lb

## 2018-07-21 DIAGNOSIS — Z0000 Encounter for general adult medical examination without abnormal findings: Secondary | ICD-10-CM

## 2018-07-21 DIAGNOSIS — Z6833 Body mass index (BMI) 33.0-33.9, adult: Secondary | ICD-10-CM

## 2018-07-21 DIAGNOSIS — Z113 Encounter for screening for infections with a predominantly sexual mode of transmission: Secondary | ICD-10-CM

## 2018-07-21 DIAGNOSIS — Z01419 Encounter for gynecological examination (general) (routine) without abnormal findings: Secondary | ICD-10-CM

## 2018-07-21 DIAGNOSIS — Z30431 Encounter for routine checking of intrauterine contraceptive device: Secondary | ICD-10-CM

## 2018-07-21 NOTE — Patient Instructions (Signed)
EXERCISE AND DIET:  We recommended that you start or continue a regular exercise program for good health. Regular exercise means any activity that makes your heart beat faster and makes you sweat.  We recommend exercising at least 30 minutes per day at least 3 days a week, preferably 4 or 5.  We also recommend a diet low in fat and sugar.  Inactivity, poor dietary choices and obesity can cause diabetes, heart attack, stroke, and kidney damage, among others.    ALCOHOL AND SMOKING:  Women should limit their alcohol intake to no more than 7 drinks/beers/glasses of wine (combined, not each!) per week. Moderation of alcohol intake to this level decreases your risk of breast cancer and liver damage. And of course, no recreational drugs are part of a healthy lifestyle.  And absolutely no smoking or even second hand smoke. Most people know smoking can cause heart and lung diseases, but did you know it also contributes to weakening of your bones? Aging of your skin?  Yellowing of your teeth and nails?  CALCIUM AND VITAMIN D:  Adequate intake of calcium and Vitamin D are recommended.  The recommendations for exact amounts of these supplements seem to change often, but generally speaking 1,000 mg of calcium (between diet and supplement) and 800 units of Vitamin D per day seems prudent. Certain women may benefit from higher intake of Vitamin D.  If you are among these women, your doctor will have told you during your visit.    PAP SMEARS:  Pap smears, to check for cervical cancer or precancers,  have traditionally been done yearly, although recent scientific advances have shown that most women can have pap smears less often.  However, every woman still should have a physical exam from her gynecologist every year. It will include a breast check, inspection of the vulva and vagina to check for abnormal growths or skin changes, a visual exam of the cervix, and then an exam to evaluate the size and shape of the uterus and  ovaries.  And after 26 years of age, a rectal exam is indicated to check for rectal cancers. We will also provide age appropriate advice regarding health maintenance, like when you should have certain vaccines, screening for sexually transmitted diseases, bone density testing, colonoscopy, mammograms, etc.   MAMMOGRAMS:  All women over 40 years old should have a yearly mammogram. Many facilities now offer a "3D" mammogram, which may cost around $50 extra out of pocket. If possible,  we recommend you accept the option to have the 3D mammogram performed.  It both reduces the number of women who will be called back for extra views which then turn out to be normal, and it is better than the routine mammogram at detecting truly abnormal areas.    COLON CANCER SCREENING: Now recommend starting at age 45. At this time colonoscopy is not covered for routine screening until 50. There are take home tests that can be done between 45-49.   COLONOSCOPY:  Colonoscopy to screen for colon cancer is recommended for all women at age 50.  We know, you hate the idea of the prep.  We agree, BUT, having colon cancer and not knowing it is worse!!  Colon cancer so often starts as a polyp that can be seen and removed at colonscopy, which can quite literally save your life!  And if your first colonoscopy is normal and you have no family history of colon cancer, most women don't have to have it again for   10 years.  Once every ten years, you can do something that may end up saving your life, right?  We will be happy to help you get it scheduled when you are ready.  Be sure to check your insurance coverage so you understand how much it will cost.  It may be covered as a preventative service at no cost, but you should check your particular policy.      Breast Self-Awareness Breast self-awareness means being familiar with how your breasts look and feel. It involves checking your breasts regularly and reporting any changes to your  health care provider. Practicing breast self-awareness is important. A change in your breasts can be a sign of a serious medical problem. Being familiar with how your breasts look and feel allows you to find any problems early, when treatment is more likely to be successful. All women should practice breast self-awareness, including women who have had breast implants. How to do a breast self-exam One way to learn what is normal for your breasts and whether your breasts are changing is to do a breast self-exam. To do a breast self-exam: Look for Changes  1. Remove all the clothing above your waist. 2. Stand in front of a mirror in a room with good lighting. 3. Put your hands on your hips. 4. Push your hands firmly downward. 5. Compare your breasts in the mirror. Look for differences between them (asymmetry), such as: ? Differences in shape. ? Differences in size. ? Puckers, dips, and bumps in one breast and not the other. 6. Look at each breast for changes in your skin, such as: ? Redness. ? Scaly areas. 7. Look for changes in your nipples, such as: ? Discharge. ? Bleeding. ? Dimpling. ? Redness. ? A change in position. Feel for Changes Carefully feel your breasts for lumps and changes. It is best to do this while lying on your back on the floor and again while sitting or standing in the shower or tub with soapy water on your skin. Feel each breast in the following way:  Place the arm on the side of the breast you are examining above your head.  Feel your breast with the other hand.  Start in the nipple area and make  inch (2 cm) overlapping circles to feel your breast. Use the pads of your three middle fingers to do this. Apply light pressure, then medium pressure, then firm pressure. The light pressure will allow you to feel the tissue closest to the skin. The medium pressure will allow you to feel the tissue that is a little deeper. The firm pressure will allow you to feel the tissue  close to the ribs.  Continue the overlapping circles, moving downward over the breast until you feel your ribs below your breast.  Move one finger-width toward the center of the body. Continue to use the  inch (2 cm) overlapping circles to feel your breast as you move slowly up toward your collarbone.  Continue the up and down exam using all three pressures until you reach your armpit.  Write Down What You Find  Write down what is normal for each breast and any changes that you find. Keep a written record with breast changes or normal findings for each breast. By writing this information down, you do not need to depend only on memory for size, tenderness, or location. Write down where you are in your menstrual cycle, if you are still menstruating. If you are having trouble noticing differences   in your breasts, do not get discouraged. With time you will become more familiar with the variations in your breasts and more comfortable with the exam. How often should I examine my breasts? Examine your breasts every month. If you are breastfeeding, the best time to examine your breasts is after a feeding or after using a breast pump. If you menstruate, the best time to examine your breasts is 5-7 days after your period is over. During your period, your breasts are lumpier, and it may be more difficult to notice changes. When should I see my health care provider? See your health care provider if you notice:  A change in shape or size of your breasts or nipples.  A change in the skin of your breast or nipples, such as a reddened or scaly area.  Unusual discharge from your nipples.  A lump or thick area that was not there before.  Pain in your breasts.  Anything that concerns you.  

## 2018-07-21 NOTE — Progress Notes (Signed)
28 y.o. G0P0000 Single White or Caucasian Hispanic or Latino female here for annual exam. Last month she had a Skyla removed and a Kyleena inserted.   She has one cycle since insertion, [facility] (better than prior to the IUD). She has had some spotting. She is currently having "ovulation pain", always gets this mid cycle. Not currently sexually, not in the last few month. Negative genprobe last month. Sexually active, female and female, no penetration.     No LMP recorded. (Menstrual status: IUD).          Sexually active: No.  The current method of family planning is IUD.    Exercising: Yes.    running, hiking Smoker:  no  Health Maintenance: Pap: 07/03/2017 WNL History of abnormal Pap:  No MMG:   04-01-17 breast abscess Colonoscopy:  Never BMD:   Never TDaP:  07-26-10 Gardasil: completed all 3    reports that she has never smoked. She has never used smokeless tobacco. She reports current alcohol use. She reports that she does not use drugs. Rare ETOH. Done with Undergrad, going to take her MCAT's this summer. Applying to med school and PA school   Past Medical History:  Diagnosis Date  . Anemia   . Anxiety   . Depression   . IBS (irritable bowel syndrome)     Past Surgical History:  Procedure Laterality Date  . BREAST SURGERY     breast abscess   . EVACUATION BREAST HEMATOMA Left 04/05/2017   Procedure: IRRIGATION AND DEBRIDMENT LEFT  BREAST ABCESS;  Surgeon: Romie Leveehomas, Alicia, MD;  Location: [facility];  Service: General;  Laterality: Left;  . INTRAUTERINE DEVICE (IUD) INSERTION  07/05/15   Skyla   . TONSILLECTOMY AND ADENOIDECTOMY  01/2017    Current Outpatient Medications  Medication Sig Dispense Refill  . acetaminophen (TYLENOL) 500 MG tablet Take 2 tablets (1,000 mg total) by mouth every 6 (six) hours as needed. 30 tablet 0  . CRANBERRY PO Take by mouth.    Marland Kitchen. ibuprofen (ADVIL,MOTRIN) 800 MG tablet Take 1 tablet (800 mg total) by mouth every 8  (eight) hours as needed. 30 tablet 1  . Levonorgestrel (KYLEENA) 19.5 MG IUD by Intrauterine route.    Marland Kitchen. LORazepam (ATIVAN) 0.5 MG tablet TAKE 1 TABLET EVERY 8 HOURS AS NEEDED FOR ANXIETY 90 tablet 2  . Multiple Vitamins-Minerals (MULTIVITAMIN PO) Take by mouth daily.    Marland Kitchen. venlafaxine XR (EFFEXOR-XR) 75 MG 24 hr capsule TAKE 1 CAPSULE BY MOUTH EVERY DAY WITH BREAKFAST 90 capsule 1   No current facility-administered medications for this visit.     Family History  Problem Relation Age of Onset  . Anemia Mother   . Hypertension Mother   . Diabetes Mellitus II Paternal Grandmother [id number]  . Breast cancer Paternal Grandmother [id number]       lumpectomy and 2 months of chemo  . Heart failure Maternal Grandfather   . Heart attack Maternal Grandfather   . Stroke Maternal Grandmother   . Diabetes Paternal Grandfather   . Depression Other   . Melanoma Other        PGF  . Hypertension Other        Maternal Side   . Stroke Maternal Aunt   . Heart attack Maternal Aunt     Review of Systems  Constitutional: Negative.   HENT: Negative.   Eyes: Negative.   Respiratory: Negative.   Cardiovascular: Negative.   Gastrointestinal: Negative.  Endocrine: Negative.   Genitourinary: Negative.   Musculoskeletal: Negative.   Skin: Negative.   Allergic/Immunologic: Negative.   Neurological: Negative.   Hematological: Negative.   Psychiatric/Behavioral: Negative.     Exam:   Pulse 84   Temp 98.3 F (36.8 C) (Skin)   Ht 5\' 8"  (1.727 m)   Wt 222 lb 6.4 oz (100.9 kg)   BMI 33.82 kg/m   Weight change: @WEIGHTCHANGE @ Height:   Height: 5\' 8"  (172.7 cm)  Ht Readings from Last 3 Encounters:  07/21/18 5\' 8"  (1.727 m)  07/03/17 5' 7.75" (1.721 m)  04/04/17 5\' 8"  (1.727 m)    General appearance: alert, cooperative and appears stated age Head: Normocephalic, without obvious abnormality, atraumatic Neck: no adenopathy, supple, symmetrical, trachea midline and thyroid normal to inspection and  palpation Lungs: clear to auscultation bilaterally Cardiovascular: regular rate and rhythm Breasts: normal appearance, no masses or tenderness Abdomen: soft, non-tender; non distended,  no masses,  no organomegaly Extremities: extremities normal, atraumatic, no cyanosis or edema Skin: Skin color, texture, turgor normal. No rashes or lesions Lymph nodes: Cervical, supraclavicular, and axillary nodes normal. No abnormal inguinal nodes palpated Neurologic: Grossly normal   Pelvic: External genitalia:  no lesions              Urethra:  normal appearing urethra with no masses, tenderness or lesions              Bartholins and Skenes: normal                 Vagina: normal appearing vagina with normal color and discharge, no lesions              Cervix: no lesions and IUD string 3-4 cm               Bimanual Exam:  Uterus:  normal size, contour, position, consistency, mobility, non-tender              Adnexa: no mass, fullness, tenderness               Rectovaginal: Confirms               Anus:  normal sphincter tone, no lesions  Chaperone was present for exam.  A:  Well Woman with normal exam  Kyleena IUD check  P:   No pap this year  Screening STD, will check blood work, recent negative cervical culture  Screening labs  Discussed breast self exam  Discussed calcium and vit D intake

## 2018-07-22 LAB — COMPREHENSIVE METABOLIC PANEL
ALT: 16 IU/L (ref 0–32)
AST: 16 IU/L (ref 0–40)
Albumin/Globulin Ratio: 1.5 (ref 1.2–2.2)
Albumin: 4.6 g/dL (ref 3.9–5.0)
Alkaline Phosphatase: 97 IU/L (ref 39–117)
BUN/Creatinine Ratio: 17 (ref 9–23)
BUN: 9 mg/dL (ref 6–20)
Bilirubin Total: 0.4 mg/dL (ref 0.0–1.2)
CO2: 19 mmol/L — ABNORMAL LOW (ref 20–29)
Calcium: 9.2 mg/dL (ref 8.7–10.2)
Chloride: 102 mmol/L (ref 96–106)
Creatinine, Ser: 0.53 mg/dL — ABNORMAL LOW (ref 0.57–1.00)
GFR calc Af Amer: 153 mL/min/{1.73_m2} (ref >59)
GFR calc non Af Amer: 132 mL/min/{1.73_m2} (ref >59)
Globulin, Total: 3 g/dL (ref 1.5–4.5)
Glucose: 111 mg/dL — ABNORMAL HIGH (ref 65–99)
Potassium: 4.3 mmol/L (ref 3.5–5.2)
Sodium: 137 mmol/L (ref 134–144)
Total Protein: 7.6 g/dL (ref 6.0–8.5)

## 2018-07-22 LAB — LIPID PANEL
Chol/HDL Ratio: 4.4 ratio (ref 0.0–4.4)
Cholesterol, Total: 177 mg/dL (ref 100–199)
HDL: 40 mg/dL (ref >39)
LDL Calculated: 109 mg/dL — ABNORMAL HIGH (ref 0–99)
Triglycerides: 140 mg/dL (ref 0–149)
VLDL Cholesterol Cal: 28 mg/dL (ref 5–40)

## 2018-07-22 LAB — CBC
Hematocrit: 40.2 % (ref 34.0–46.6)
Hemoglobin: 12.9 g/dL (ref 11.1–15.9)
MCH: 27.4 pg (ref 26.6–33.0)
MCHC: 32.1 g/dL (ref 31.5–35.7)
MCV: 86 fL (ref 79–97)
Platelets: 232 10*3/uL (ref 150–450)
RBC: 4.7 x10E6/uL (ref 3.77–5.28)
RDW: 13.3 % (ref 11.7–15.4)
WBC: 7.6 10*3/uL (ref 3.4–10.8)

## 2018-07-22 LAB — HEP, RPR, HIV PANEL
HIV Screen 4th Generation wRfx: NONREACTIVE
Hepatitis B Surface Ag: NEGATIVE
RPR Ser Ql: NONREACTIVE

## 2018-07-22 LAB — HEPATITIS C ANTIBODY: Hep C Virus Ab: <0.1 s/co ratio (ref 0.0–0.9)

## 2018-07-24 ENCOUNTER — Ambulatory Visit (INDEPENDENT_AMBULATORY_CARE_PROVIDER_SITE_OTHER): Payer: 59 | Admitting: Psychology

## 2018-07-24 DIAGNOSIS — F331 Major depressive disorder, recurrent, moderate: Secondary | ICD-10-CM | POA: Diagnosis not present

## 2018-08-07 ENCOUNTER — Ambulatory Visit (INDEPENDENT_AMBULATORY_CARE_PROVIDER_SITE_OTHER): Payer: 59 | Admitting: Psychology

## 2018-08-07 DIAGNOSIS — F331 Major depressive disorder, recurrent, moderate: Secondary | ICD-10-CM | POA: Diagnosis not present

## 2018-08-21 ENCOUNTER — Ambulatory Visit (INDEPENDENT_AMBULATORY_CARE_PROVIDER_SITE_OTHER): Payer: 59 | Admitting: Psychology

## 2018-08-21 DIAGNOSIS — F331 Major depressive disorder, recurrent, moderate: Secondary | ICD-10-CM

## 2018-09-04 ENCOUNTER — Ambulatory Visit (INDEPENDENT_AMBULATORY_CARE_PROVIDER_SITE_OTHER): Payer: 59 | Admitting: Psychology

## 2018-09-04 DIAGNOSIS — F331 Major depressive disorder, recurrent, moderate: Secondary | ICD-10-CM

## 2018-09-18 ENCOUNTER — Ambulatory Visit: Payer: 59 | Admitting: Psychology

## 2018-10-02 ENCOUNTER — Ambulatory Visit (INDEPENDENT_AMBULATORY_CARE_PROVIDER_SITE_OTHER): Payer: 59 | Admitting: Psychology

## 2018-10-02 DIAGNOSIS — F331 Major depressive disorder, recurrent, moderate: Secondary | ICD-10-CM

## 2018-10-16 ENCOUNTER — Ambulatory Visit (INDEPENDENT_AMBULATORY_CARE_PROVIDER_SITE_OTHER): Payer: 59 | Admitting: Psychology

## 2018-10-16 DIAGNOSIS — F331 Major depressive disorder, recurrent, moderate: Secondary | ICD-10-CM

## 2018-10-30 ENCOUNTER — Ambulatory Visit (INDEPENDENT_AMBULATORY_CARE_PROVIDER_SITE_OTHER): Payer: 59 | Admitting: Psychology

## 2018-10-30 DIAGNOSIS — F331 Major depressive disorder, recurrent, moderate: Secondary | ICD-10-CM | POA: Diagnosis not present

## 2018-11-13 ENCOUNTER — Ambulatory Visit: Payer: 59 | Admitting: Psychology

## 2018-11-27 ENCOUNTER — Ambulatory Visit (INDEPENDENT_AMBULATORY_CARE_PROVIDER_SITE_OTHER): Payer: 59 | Admitting: Psychology

## 2018-11-27 DIAGNOSIS — F331 Major depressive disorder, recurrent, moderate: Secondary | ICD-10-CM | POA: Diagnosis not present

## 2018-12-11 ENCOUNTER — Ambulatory Visit: Payer: 59 | Admitting: Psychology

## 2018-12-25 ENCOUNTER — Ambulatory Visit (INDEPENDENT_AMBULATORY_CARE_PROVIDER_SITE_OTHER): Payer: 59 | Admitting: Psychology

## 2018-12-25 ENCOUNTER — Other Ambulatory Visit: Payer: Self-pay | Admitting: Family Medicine

## 2018-12-25 DIAGNOSIS — F331 Major depressive disorder, recurrent, moderate: Secondary | ICD-10-CM

## 2018-12-25 NOTE — Telephone Encounter (Signed)
Patient need to schedule an ov for more refills. 

## 2019-01-20 ENCOUNTER — Other Ambulatory Visit: Payer: Self-pay | Admitting: Family Medicine

## 2019-01-20 MED ORDER — VENLAFAXINE HCL ER 75 MG PO CP24: ORAL_CAPSULE | ORAL | 0 refills | Status: DC

## 2019-01-20 NOTE — Telephone Encounter (Signed)
Copied from [place] [id number]. Topic: Quick Communication - Rx Refill/Question >> Jan 20, 2019 12:57 PM Leward Quan A wrote: Medication: venlafaxine XR (EFFEXOR-XR) 75 MG 24 hr capsule   Has the patient contacted their pharmacy? Yes.   (Agent: If no, request that the patient contact the pharmacy for the refill.) (Agent: If yes, when and what did the pharmacy advise?)  Preferred Pharmacy (with phone number or street name): [id number] - [place], [place] [phone] (Phone) [phone] (Fax)    Agent: Please be advised that RX refills may take up to 3 business days. We ask that you follow-up with your pharmacy.

## 2019-01-22 ENCOUNTER — Ambulatory Visit (INDEPENDENT_AMBULATORY_CARE_PROVIDER_SITE_OTHER): Payer: 59 | Admitting: Psychology

## 2019-01-22 DIAGNOSIS — F331 Major depressive disorder, recurrent, moderate: Secondary | ICD-10-CM | POA: Diagnosis not present

## 2019-01-23 ENCOUNTER — Telehealth (INDEPENDENT_AMBULATORY_CARE_PROVIDER_SITE_OTHER): Payer: 59 | Admitting: Family Medicine

## 2019-01-23 ENCOUNTER — Encounter: Payer: Self-pay | Admitting: Family Medicine

## 2019-01-23 DIAGNOSIS — F418 Other specified anxiety disorders: Secondary | ICD-10-CM | POA: Diagnosis not present

## 2019-01-23 MED ORDER — LORAZEPAM 0.5 MG PO TABS: ORAL_TABLET | ORAL | 2 refills | Status: DC

## 2019-01-23 MED ORDER — VENLAFAXINE HCL ER 75 MG PO CP24: ORAL_CAPSULE | ORAL | 3 refills | Status: DC

## 2019-01-23 NOTE — Progress Notes (Signed)
Virtual Visit via Video Note  I connected with the patient on 01/23/19 at  1:30 PM EST by a video enabled telemedicine application and verified that I am speaking with the correct person using two identifiers.  Location patient: home Location provider:work or home office Persons participating in the virtual visit: patient, provider  I discussed the limitations of evaluation and management by telemedicine and the availability of in person appointments. The patient expressed understanding and agreed to proceed.   HPI: Here for medication refills. She is doing well. The depression and anxiety are stable. She is seeing a therapist regularly. She passed her MCAT exam and now she is applying to medical schools.    ROS: See pertinent positives and negatives per HPI.  Past Medical History:  Diagnosis Date  . Anemia   . Anxiety   . Depression   . IBS (irritable bowel syndrome)     Past Surgical History:  Procedure Laterality Date  . BREAST SURGERY     breast abscess   . EVACUATION BREAST HEMATOMA Left 04/05/2017   Procedure: IRRIGATION AND DEBRIDMENT LEFT  BREAST ABCESS;  Surgeon: Romie Levee, MD;  Location: [facility];  Service: General;  Laterality: Left;  . INTRAUTERINE DEVICE (IUD) INSERTION  07/05/15   Skyla   . TONSILLECTOMY AND ADENOIDECTOMY  01/2017    Family History  Problem Relation Age of Onset  . Anemia Mother   . Hypertension Mother   . Diabetes Mellitus II Paternal Grandmother 34  . Breast cancer Paternal Grandmother 69       lumpectomy and 2 months of chemo  . Heart failure Maternal Grandfather   . Heart attack Maternal Grandfather   . Stroke Maternal Grandmother   . Diabetes Paternal Grandfather   . Depression Other   . Melanoma Other        PGF  . Hypertension Other        Maternal Side   . Stroke Maternal Aunt   . Heart attack Maternal Aunt      Current Outpatient Medications:  .  acetaminophen (TYLENOL) 500 MG tablet, Take 2 tablets (1,000 mg total) by  mouth every 6 (six) hours as needed., Disp: 30 tablet, Rfl: 0 .  CRANBERRY PO, Take by mouth., Disp: , Rfl:  .  ibuprofen (ADVIL,MOTRIN) 800 MG tablet, Take 1 tablet (800 mg total) by mouth every 8 (eight) hours as needed., Disp: 30 tablet, Rfl: 1 .  Levonorgestrel (KYLEENA) 19.5 MG IUD, by Intrauterine route., Disp: , Rfl:  .  LORazepam (ATIVAN) 0.5 MG tablet, TAKE 1 TABLET EVERY 8 HOURS AS NEEDED FOR ANXIETY, Disp: 90 tablet, Rfl: 2 .  Multiple Vitamins-Minerals (MULTIVITAMIN PO), Take by mouth daily., Disp: , Rfl:  .  venlafaxine XR (EFFEXOR-XR) 75 MG 24 hr capsule, TAKE 1 CAPSULE BY MOUTH EVERY DAY WITH BREAKFAST, Disp: 90 capsule, Rfl: 3  EXAM:  VITALS per patient if applicable:  GENERAL: alert, oriented, appears well and in no acute distress  HEENT: atraumatic, conjunttiva clear, no obvious abnormalities on inspection of external nose and ears  NECK: normal movements of the head and neck  LUNGS: on inspection no signs of respiratory distress, breathing rate appears normal, no obvious gross SOB, gasping or wheezing  CV: no obvious cyanosis  MS: moves all visible extremities without noticeable abnormality  PSYCH/NEURO: pleasant and cooperative, no obvious depression or anxiety, speech and thought processing grossly intact  ASSESSMENT AND PLAN: Depression with anxiety, stable. meds were refilled.  Gershon Crane, MD  Discussed the  following assessment and plan:  Depression with anxiety     I discussed the assessment and treatment plan with the patient. The patient was provided an opportunity to ask questions and all were answered. The patient agreed with the plan and demonstrated an understanding of the instructions.   The patient was advised to call back or seek an in-person evaluation if the symptoms worsen or if the condition fails to improve as anticipated.

## 2019-02-05 ENCOUNTER — Ambulatory Visit: Payer: 59 | Admitting: Psychology

## 2019-02-17 ENCOUNTER — Ambulatory Visit: Payer: BC Managed Care – PPO | Attending: Internal Medicine

## 2019-02-17 DIAGNOSIS — Z20822 Contact with and (suspected) exposure to covid-19: Secondary | ICD-10-CM | POA: Diagnosis not present

## 2019-02-19 ENCOUNTER — Ambulatory Visit: Payer: 59 | Admitting: Psychology

## 2019-02-19 LAB — NOVEL CORONAVIRUS, NAA: SARS-CoV-2, NAA: NOT DETECTED

## 2019-03-05 ENCOUNTER — Ambulatory Visit: Payer: 59 | Admitting: Psychology

## 2019-03-19 ENCOUNTER — Ambulatory Visit: Payer: BC Managed Care – PPO | Admitting: Psychology

## 2019-04-02 ENCOUNTER — Ambulatory Visit (INDEPENDENT_AMBULATORY_CARE_PROVIDER_SITE_OTHER): Payer: BC Managed Care – PPO | Admitting: Psychology

## 2019-04-02 DIAGNOSIS — F331 Major depressive disorder, recurrent, moderate: Secondary | ICD-10-CM | POA: Diagnosis not present

## 2019-04-02 DIAGNOSIS — F411 Generalized anxiety disorder: Secondary | ICD-10-CM | POA: Diagnosis not present

## 2019-04-16 ENCOUNTER — Ambulatory Visit: Payer: 59 | Admitting: Psychology

## 2019-04-30 ENCOUNTER — Ambulatory Visit (INDEPENDENT_AMBULATORY_CARE_PROVIDER_SITE_OTHER): Payer: BC Managed Care – PPO | Admitting: Psychology

## 2019-04-30 DIAGNOSIS — F411 Generalized anxiety disorder: Secondary | ICD-10-CM | POA: Diagnosis not present

## 2019-04-30 DIAGNOSIS — F331 Major depressive disorder, recurrent, moderate: Secondary | ICD-10-CM

## 2019-05-14 ENCOUNTER — Ambulatory Visit: Payer: 59 | Admitting: Psychology

## 2019-05-28 ENCOUNTER — Ambulatory Visit: Payer: BC Managed Care – PPO | Admitting: Psychology

## 2019-06-11 ENCOUNTER — Ambulatory Visit: Payer: BC Managed Care – PPO | Admitting: Psychology

## 2019-06-25 ENCOUNTER — Ambulatory Visit: Payer: BC Managed Care – PPO | Admitting: Psychology

## 2019-07-09 ENCOUNTER — Ambulatory Visit (INDEPENDENT_AMBULATORY_CARE_PROVIDER_SITE_OTHER): Payer: BC Managed Care – PPO | Admitting: Psychology

## 2019-07-09 DIAGNOSIS — F331 Major depressive disorder, recurrent, moderate: Secondary | ICD-10-CM

## 2019-07-09 DIAGNOSIS — F411 Generalized anxiety disorder: Secondary | ICD-10-CM

## 2019-07-23 ENCOUNTER — Ambulatory Visit: Payer: BC Managed Care – PPO | Admitting: Psychology

## 2019-07-23 ENCOUNTER — Ambulatory Visit: Payer: 59 | Admitting: Obstetrics and Gynecology

## 2019-08-06 ENCOUNTER — Ambulatory Visit: Payer: BC Managed Care – PPO | Admitting: Psychology

## 2019-08-20 ENCOUNTER — Ambulatory Visit: Payer: BC Managed Care – PPO | Admitting: Psychology

## 2019-08-25 ENCOUNTER — Ambulatory Visit: Payer: BC Managed Care – PPO | Admitting: Family Medicine

## 2019-08-28 ENCOUNTER — Ambulatory Visit: Payer: BC Managed Care – PPO | Admitting: Family Medicine

## 2019-08-28 DIAGNOSIS — Z0289 Encounter for other administrative examinations: Secondary | ICD-10-CM

## 2019-09-03 ENCOUNTER — Ambulatory Visit: Payer: BC Managed Care – PPO | Admitting: Psychology

## 2019-11-18 DIAGNOSIS — Z20822 Contact with and (suspected) exposure to covid-19: Secondary | ICD-10-CM | POA: Diagnosis not present

## 2019-11-28 ENCOUNTER — Ambulatory Visit: Payer: BC Managed Care – PPO | Attending: Internal Medicine

## 2019-11-28 ENCOUNTER — Other Ambulatory Visit: Payer: Self-pay

## 2019-11-28 DIAGNOSIS — Z23 Encounter for immunization: Secondary | ICD-10-CM

## 2019-11-28 NOTE — Progress Notes (Signed)
   Covid-19 Vaccination Clinic  Name:  April Rivas    MRN: [id number] DOB: 02/24/1990  11/28/2019  [name] was observed post Covid-19 immunization for 15 minutes without incident. She was provided with Vaccine Information Sheet and instruction to access the V-Safe system.   [name] was instructed to call 911 with any severe reactions post vaccine: Marland Kitchen Difficulty breathing  . Swelling of face and throat  . A fast heartbeat  . A bad rash all over body  . Dizziness and weakness

## 2020-03-19 ENCOUNTER — Other Ambulatory Visit: Payer: Self-pay | Admitting: Family Medicine

## 2020-03-21 NOTE — Telephone Encounter (Signed)
Appointment needed for future refills

## 2020-04-03 ENCOUNTER — Other Ambulatory Visit: Payer: Self-pay | Admitting: Family Medicine

## 2021-06-06 ENCOUNTER — Ambulatory Visit: Payer: Self-pay | Admitting: Obstetrics and Gynecology

## 2021-06-06 NOTE — Progress Notes (Deleted)
31 y.o. G0P0000 Single White or Caucasian Hispanic or Latino female here for annual exam.      No LMP recorded. (Menstrual status: IUD).          Sexually active: {yes no:314532}  The current method of family planning is IUD.    Exercising: {yes no:314532}  {types:19826} Smoker:  {YES [id number]  Health Maintenance: Pap:   07/03/2017 WNL 03/30/14 Normal  History of abnormal Pap:  no MMG:  04-01-17 breast abscess  BMD:   none  Colonoscopy: none  TDaP:  07/26/10  Gardasil: complete    reports that she has never smoked. She has never used smokeless tobacco. She reports current alcohol use. She reports that she does not use drugs.  Past Medical History:  Diagnosis Date   Anemia    Anxiety    Depression    IBS (irritable bowel syndrome)     Past Surgical History:  Procedure Laterality Date   BREAST SURGERY     breast abscess    EVACUATION BREAST HEMATOMA Left 04/05/2017   Procedure: IRRIGATION AND DEBRIDMENT LEFT  BREAST ABCESS;  Surgeon: Leighton Ruff, MD;  Location: [facility];  Service: General;  Laterality: Left;   INTRAUTERINE DEVICE (IUD) INSERTION  07/05/15   Skyla    TONSILLECTOMY AND ADENOIDECTOMY  01/2017    Current Outpatient Medications  Medication Sig Dispense Refill   acetaminophen (TYLENOL) 500 MG tablet Take 2 tablets (1,000 mg total) by mouth every 6 (six) hours as needed. 30 tablet 0   CRANBERRY PO Take by mouth.     ibuprofen (ADVIL,MOTRIN) 800 MG tablet Take 1 tablet (800 mg total) by mouth every 8 (eight) hours as needed. 30 tablet 1   Levonorgestrel (KYLEENA) 19.5 MG IUD by Intrauterine route.     LORazepam (ATIVAN) 0.5 MG tablet TAKE 1 TABLET EVERY 8 HOURS AS NEEDED FOR ANXIETY 90 tablet 2   Multiple Vitamins-Minerals (MULTIVITAMIN PO) Take by mouth daily.     venlafaxine XR (EFFEXOR-XR) 75 MG 24 hr capsule TAKE 1 CAPSULE BY MOUTH EVERY DAY WITH BREAKFAST 30 capsule 0   No current facility-administered medications for this visit.    Family History  Problem  Relation Age of Onset   Anemia Mother    Hypertension Mother    Diabetes Mellitus II Paternal Grandmother 68   Breast cancer Paternal Grandmother 66       lumpectomy and 2 months of chemo   Heart failure Maternal Grandfather    Heart attack Maternal Grandfather    Stroke Maternal Grandmother    Diabetes Paternal Grandfather    Depression Other    Melanoma Other        PGF   Hypertension Other        Maternal Side    Stroke Maternal Aunt    Heart attack Maternal Aunt     Review of Systems  All other systems reviewed and are negative.  Exam:   There were no vitals taken for this visit.  Weight change: @WEIGHTCHANGE @ Height:      Ht Readings from Last 3 Encounters:  07/21/18 5\' 8"  (1.727 m)  07/03/17 5' 7.75" (1.721 m)  04/04/17 5\' 8"  (1.727 m)    General appearance: alert, cooperative and appears stated age Head: Normocephalic, without obvious abnormality, atraumatic Neck: no adenopathy, supple, symmetrical, trachea midline and thyroid {CHL AMB PHY EX THYROID NORM [phone]::"normal to inspection and palpation"} Lungs: clear to auscultation bilaterally Cardiovascular: regular rate and rhythm Breasts: {Exam; breast:13139::"normal appearance, no masses or tenderness"}  Abdomen: soft, non-tender; non distended,  no masses,  no organomegaly Extremities: extremities normal, atraumatic, no cyanosis or edema Skin: Skin color, texture, turgor normal. No rashes or lesions Lymph nodes: Cervical, supraclavicular, and axillary nodes normal. No abnormal inguinal nodes palpated Neurologic: Grossly normal   Pelvic: External genitalia:  no lesions              Urethra:  normal appearing urethra with no masses, tenderness or lesions              Bartholins and Skenes: normal                 Vagina: normal appearing vagina with normal color and discharge, no lesions              Cervix: {CHL AMB PHY EX CERVIX NORM DEFAULT:[phone]::"no lesions"}               Bimanual Exam:   Uterus:  {CHL AMB PHY EX UTERUS NORM DEFAULT:[phone]::"normal size, contour, position, consistency, mobility, non-tender"}              Adnexa: {CHL AMB PHY EX ADNEXA NO MASS [phone]::"no mass, fullness, tenderness"}               Rectovaginal: Confirms               Anus:  normal sphincter tone, no lesions  *** chaperoned for the exam.  A:  Well Woman with normal exam  P:

## 2021-06-13 ENCOUNTER — Ambulatory Visit: Payer: Self-pay | Admitting: Obstetrics and Gynecology

## 2021-06-19 NOTE — Progress Notes (Signed)
31 y.o. G0P0000 Single White or Caucasian Hispanic or Latino female here for annual exam.  She has a kyleena IUD, inserted in 5/20. Not currently sexually active.  ?Period Cycle (Days): 28 ?Period Duration (Days): 5 ?Period Pattern: Regular ?Menstrual Flow: Light ?Menstrual Control: Thin pad, Tampon ?Menstrual Control Change Freq (Hours): 4 ?Dysmenorrhea: (!) Moderate ?Dysmenorrhea Symptoms: Cramping ? ?H/O anxiety and depression. Currently off of medication. Symptoms are worse prior to her cycle.  ? ?Patient's last menstrual period was 06/21/2021.          ?Sexually active: No.  ?The current method of family planning is IUD.   Kyleena placed 06/2018  ?Exercising: Yes.     Cardio and weights  ?Smoker:  no ? ?Health Maintenance: ?Pap:  07/03/2017 WNL ?History of abnormal Pap:  no ?MMG:  04/01/17 breast abscess  ?BMD:   never  ?Colonoscopy: never  ?TDaP:  2012 ?Gardasil: complete  ? ? reports that she has never smoked. She has never used smokeless tobacco. She reports current alcohol use. She reports that she does not use drugs. She wants to go to grad school for public health, wants to get her masters. Working as a Social worker.  ? ?Past Medical History:  ?Diagnosis Date  ? Anemia   ? Anxiety   ? Depression   ? IBS (irritable bowel syndrome)   ? ? ?Past Surgical History:  ?Procedure Laterality Date  ? BREAST SURGERY    ? breast abscess   ? EVACUATION BREAST HEMATOMA Left 04/05/2017  ? Procedure: IRRIGATION AND DEBRIDMENT LEFT  BREAST ABCESS;  Surgeon: Romie Levee, MD;  Location: [facility];  Service: General;  Laterality: Left;  ? INTRAUTERINE DEVICE (IUD) INSERTION  07/05/15  ? Skyla   ? TONSILLECTOMY AND ADENOIDECTOMY  01/2017  ? ? ?Current Outpatient Medications  ?Medication Sig Dispense Refill  ? acetaminophen (TYLENOL) 500 MG tablet Take 2 tablets (1,000 mg total) by mouth every 6 (six) hours as needed. 30 tablet 0  ? ibuprofen (ADVIL,MOTRIN) 800 MG tablet Take 1 tablet (800 mg total) by mouth every 8 (eight) hours as  needed. 30 tablet 1  ? levonorgestrel (KYLEENA) 19.5 MG IUD by Intrauterine route.    ? ?No current facility-administered medications for this visit.  ? ? ?Family History  ?Problem Relation Age of Onset  ? Anemia Mother   ? Hypertension Mother   ? Diabetes Mellitus II Paternal Grandmother 45  ? Breast cancer Paternal Grandmother 70  ?     lumpectomy and 2 months of chemo  ? Heart failure Maternal Grandfather   ? Heart attack Maternal Grandfather   ? Stroke Maternal Grandmother   ? Diabetes Paternal Grandfather   ? Depression Other   ? Melanoma Other   ?     PGF  ? Hypertension Other   ?     Maternal Side   ? Stroke Maternal Aunt   ? Heart attack Maternal Aunt   ? ? ?Review of Systems  ?All other systems reviewed and are negative. ? ?Exam:   ?Pulse (!) 102   Ht 5\' 8"  (1.727 m)   LMP 06/21/2021   SpO2 100%   BMI 33.82 kg/m?   Weight change: @WEIGHTCHANGE @ Height:   Height: 5\' 8"  (172.7 cm)  ?Ht Readings from Last 3 Encounters:  ?06/26/21 5\' 8"  (1.727 m)  ?07/21/18 5\' 8"  (1.727 m)  ?07/03/17 5' 7.75" (1.721 m)  ? ? ?General appearance: alert, cooperative and appears stated age ?Head: Normocephalic, without obvious abnormality, atraumatic ?Neck: no adenopathy,  supple, symmetrical, trachea midline and thyroid normal to inspection and palpation ?Lungs: clear to auscultation bilaterally ?Cardiovascular: regular rate and rhythm ?Breasts: normal appearance, no masses or tenderness ?Abdomen: soft, non-tender; non distended,  no masses,  no organomegaly ?Extremities: extremities normal, atraumatic, no cyanosis or edema ?Skin: Skin color, texture, turgor normal. No rashes or lesions ?Lymph nodes: Cervical, supraclavicular, and axillary nodes normal. ?No abnormal inguinal nodes palpated ?Neurologic: Grossly normal ? ? ?Pelvic: External genitalia:  no lesions ?             Urethra:  normal appearing urethra with no masses, tenderness or lesions ?             Bartholins and Skenes: normal    ?             Vagina: normal  appearing vagina with normal color and discharge, no lesions ?             Cervix: no lesions and IUD strings are 3-4 cm ?              ?Bimanual Exam:  Uterus:   no masses or tenderness ?             Adnexa: no mass, fullness, tenderness ?              Rectovaginal: Confirms ?              Anus:  normal sphincter tone, no lesions ? ?Carolynn Serve chaperoned for the exam. ? ?1. Well woman exam ?Discussed breast self exam ?Discussed calcium and vit D intake ?She will get her lab work with her primary ? ?2. Screening for cervical cancer ?- Cytology - PAP ? ?3. Screening examination for STD (sexually transmitted disease) ?- Cytology - PAP ? ?4. Depression with anxiety ?She hasn't done well on SSRI's in the past. She would like to try Wellbutrin.  ?- buPROPion (WELLBUTRIN XL) 150 MG 24 hr tablet; Take 1 tablet (150 mg total) by mouth daily.  Dispense: 30 tablet; Refill: 1 ?-She will either f/u with me or Dr Clent Ridges ? ?CC: Dr Clent Ridges ? ? ? ?

## 2021-06-26 ENCOUNTER — Encounter: Payer: Self-pay | Admitting: Obstetrics and Gynecology

## 2021-06-26 ENCOUNTER — Other Ambulatory Visit (HOSPITAL_COMMUNITY)
Admission: RE | Admit: 2021-06-26 | Discharge: 2021-06-26 | Disposition: A | Discharge status: Home / Self Care | Payer: BC Managed Care – PPO | Source: Ambulatory Visit | Attending: Obstetrics and Gynecology | Admitting: Obstetrics and Gynecology

## 2021-06-26 ENCOUNTER — Ambulatory Visit (INDEPENDENT_AMBULATORY_CARE_PROVIDER_SITE_OTHER): Payer: BC Managed Care – PPO | Admitting: Obstetrics and Gynecology

## 2021-06-26 VITALS — HR 102 | Ht 68.0 in

## 2021-06-26 DIAGNOSIS — Z113 Encounter for screening for infections with a predominantly sexual mode of transmission: Secondary | ICD-10-CM | POA: Insufficient documentation

## 2021-06-26 DIAGNOSIS — Z124 Encounter for screening for malignant neoplasm of cervix: Secondary | ICD-10-CM | POA: Diagnosis not present

## 2021-06-26 DIAGNOSIS — Z01419 Encounter for gynecological examination (general) (routine) without abnormal findings: Secondary | ICD-10-CM | POA: Diagnosis not present

## 2021-06-26 DIAGNOSIS — F418 Other specified anxiety disorders: Secondary | ICD-10-CM | POA: Diagnosis not present

## 2021-06-26 MED ORDER — BUPROPION HCL ER (XL) 150 MG PO TB24: 150.0000 mg | ORAL_TABLET | Freq: Every day | ORAL | 1 refills | Status: DC

## 2021-06-26 NOTE — Patient Instructions (Signed)
EXERCISE   We recommended that you start or continue a regular exercise program for good health. Physical activity is anything that gets your body moving, some is better than none. The CDC recommends 150 minutes per week of Moderate-Intensity Aerobic Activity and 2 or more days of Muscle Strengthening Activity. ? ?Benefits of exercise are limitless: helps weight loss/weight maintenance, improves mood and energy, helps with depression and anxiety, improves sleep, tones and strengthens muscles, improves balance, improves bone density, protects from chronic conditions such as heart disease, high blood pressure and diabetes and so much more. ?To learn more visit: WhyNotPoker.uy ? ?DIET: Good nutrition starts with a healthy diet of fruits, vegetables, whole grains, and lean protein sources. Drink plenty of water for hydration. Minimize empty calories, sodium, sweets. For more information about dietary recommendations visit: GeekRegister.com.ee and http://schaefer-mitchell.com/ ? ?ALCOHOL:  Women should limit their alcohol intake to no more than 7 drinks/beers/glasses of wine (combined, not each!) per week. Moderation of alcohol intake to this level decreases your risk of breast cancer and liver damage.  If you are concerned that you may have a problem, or your friends have told you they are concerned about your drinking, there are many resources to help. A well-known program that is free, effective, and available to all people all over the nation is Alcoholics Anonymous.  Check out this site to learn more: BlockTaxes.se ? ? ?CALCIUM AND VITAMIN D:  Adequate intake of calcium and Vitamin D are recommended for bone health.  You should be getting between 1000-1200 mg of calcium and 800 units of Vitamin D daily between diet and supplements ? ?PAP SMEARS:  Pap smears, to check for cervical cancer or precancers,  have traditionally been  done yearly, scientific advances have shown that most women can have pap smears less often.  However, every woman still should have a physical exam from her gynecologist every year. It will include a breast check, inspection of the vulva and vagina to check for abnormal growths or skin changes, a visual exam of the cervix, and then an exam to evaluate the size and shape of the uterus and ovaries. We will also provide age appropriate advice regarding health maintenance, like when you should have certain vaccines, screening for sexually transmitted diseases, bone density testing, colonoscopy, mammograms, etc.  ? ?MAMMOGRAMS:  All women over 82 years old should have a routine mammogram.  ? ?COLON CANCER SCREENING: Now recommend starting at age 60. At this time colonoscopy is not covered for routine screening until 50. There are take home tests that can be done between 45-49.  ? ?COLONOSCOPY:  Colonoscopy to screen for colon cancer is recommended for all women at age 27.  We know, you hate the idea of the prep.  We agree, BUT, having colon cancer and not knowing it is worse!!  Colon cancer so often starts as a polyp that can be seen and removed at colonscopy, which can quite literally save your life!  And if your first colonoscopy is normal and you have no family history of colon cancer, most women don't have to have it again for 10 years.  Once every ten years, you can do something that may end up saving your life, right?  We will be happy to help you get it scheduled when you are ready.  Be sure to check your insurance coverage so you understand how much it will cost.  It may be covered as a preventative service at no cost, but you should check  your particular policy.   ? ? ? ?Breast Self-Awareness ?Breast self-awareness means being familiar with how your breasts look and feel. It involves checking your breasts regularly and reporting any changes to your health care provider. ?Practicing breast self-awareness is  important. A change in your breasts can be a sign of a serious medical problem. Being familiar with how your breasts look and feel allows you to find any problems early, when treatment is more likely to be successful. All women should practice breast self-awareness, including women who have had breast implants. ?How to do a breast self-exam ?One way to learn what is normal for your breasts and whether your breasts are changing is to do a breast self-exam. To do a breast self-exam: ?Look for Changes ? ?Remove all the clothing above your waist. ?Stand in front of a mirror in a room with good lighting. ?Put your hands on your hips. ?Push your hands firmly downward. ?Compare your breasts in the mirror. Look for differences between them (asymmetry), such as: ?Differences in shape. ?Differences in size. ?Puckers, dips, and bumps in one breast and not the other. ?Look at each breast for changes in your skin, such as: ?Redness. ?Scaly areas. ?Look for changes in your nipples, such as: ?Discharge. ?Bleeding. ?Dimpling. ?Redness. ?A change in position. ?Feel for Changes ?Carefully feel your breasts for lumps and changes. It is best to do this while lying on your back on the floor and again while sitting or standing in the shower or tub with soapy water on your skin. Feel each breast in the following way: ?Place the arm on the side of the breast you are examining above your head. ?Feel your breast with the other hand. ?Start in the nipple area and make ? inch (2 cm) overlapping circles to feel your breast. Use the pads of your three middle fingers to do this. Apply light pressure, then medium pressure, then firm pressure. The light pressure will allow you to feel the tissue closest to the skin. The medium pressure will allow you to feel the tissue that is a little deeper. The firm pressure will allow you to feel the tissue close to the ribs. ?Continue the overlapping circles, moving downward over the breast until you feel your  ribs below your breast. ?Move one finger-width toward the center of the body. Continue to use the ? inch (2 cm) overlapping circles to feel your breast as you move slowly up toward your collarbone. ?Continue the up and down exam using all three pressures until you reach your armpit. ? ?Write Down What You Find ? ?Write down what is normal for each breast and any changes that you find. Keep a written record with breast changes or normal findings for each breast. By writing this information down, you do not need to depend only on memory for size, tenderness, or location. Write down where you are in your menstrual cycle, if you are still menstruating. ?If you are having trouble noticing differences in your breasts, do not get discouraged. With time you will become more familiar with the variations in your breasts and more comfortable with the exam. ?How often should I examine my breasts? ?Examine your breasts every month. If you are breastfeeding, the best time to examine your breasts is after a feeding or after using a breast pump. If you menstruate, the best time to examine your breasts is 5-7 days after your period is over. During your period, your breasts are lumpier, and it may be more  difficult to notice changes. ?When should I see my health care provider? ?See your health care provider if you notice: ?A change in shape or size of your breasts or nipples. ?A change in the skin of your breast or nipples, such as a reddened or scaly area. ?Unusual discharge from your nipples. ?A lump or thick area that was not there before. ?Pain in your breasts. ?Anything that concerns you. ?Managing Depression, Adult ?Depression is a mental health condition that affects your thoughts, feelings, and actions. Being diagnosed with depression can bring you relief if you did not know why you have felt or behaved a certain way. It could also leave you feeling overwhelmed with uncertainty about your future. Preparing yourself to manage  your symptoms can help you feel more positive about your future. ?How to manage lifestyle changes ?Managing stress ? ?Stress is your body's reaction to life changes and events, both good and bad. Stress can

## 2021-06-27 LAB — CYTOLOGY - PAP
Chlamydia: NEGATIVE
Comment: NEGATIVE
Comment: NEGATIVE
Comment: NORMAL
Diagnosis: NEGATIVE
Neisseria Gonorrhea: NEGATIVE
Trichomonas: NEGATIVE

## 2021-07-19 ENCOUNTER — Other Ambulatory Visit: Payer: Self-pay | Admitting: Obstetrics and Gynecology

## 2021-07-19 DIAGNOSIS — F418 Other specified anxiety disorders: Secondary | ICD-10-CM

## 2021-07-19 NOTE — Telephone Encounter (Signed)
The patient is trying the medication for 30 days, needs to f/u prior to 90 day refill.

## 2021-07-19 NOTE — Telephone Encounter (Signed)
Note attached to Rx

## 2021-07-25 ENCOUNTER — Encounter: Payer: Self-pay | Admitting: Family Medicine

## 2021-07-25 ENCOUNTER — Ambulatory Visit (INDEPENDENT_AMBULATORY_CARE_PROVIDER_SITE_OTHER): Payer: BC Managed Care – PPO | Admitting: Family Medicine

## 2021-07-25 VITALS — BP 160/130 | HR 100 | Temp 98.7°F | Ht 68.0 in | Wt 222.4 lb

## 2021-07-25 DIAGNOSIS — R1011 Right upper quadrant pain: Secondary | ICD-10-CM

## 2021-07-25 DIAGNOSIS — L299 Pruritus, unspecified: Secondary | ICD-10-CM

## 2021-07-25 DIAGNOSIS — F418 Other specified anxiety disorders: Secondary | ICD-10-CM

## 2021-07-25 DIAGNOSIS — K8020 Calculus of gallbladder without cholecystitis without obstruction: Secondary | ICD-10-CM | POA: Diagnosis not present

## 2021-07-25 NOTE — Progress Notes (Signed)
   Subjective:    Patient ID: April Rivas, female    DOB: 03-Oct-1990, 31 y.o.   MRN: [id number]  HPI Here for 2 issues. First she has had 3 days of intermittent RUQ abdominal pains that often radiate to the right shoulder blade. This only occur about 30-40 minutes after eating a meal, then they last about 3 hours. She has had 3 episodes of this. No hearburn or nausea or fever. Her BMs have been regular. She has learned that not eating can prevent the pains, so she has only eaten and small amount of rice in the last 48 hours. Also she thinks she may be allergic to Wellbutrin. She has taken several medications for depression and anxiety in her life, such as Zoloft, Prozac, and Effexor, but she had never tried Wellbutrin before. Her GYN, Dr. Sumner Boast, started her on Wellbutrin about 3 weeks ago, and [name] was very pleased with how it helped her. However after about 10 days of taking it she began to itch all over her body so she stopped it. The itching promptly went away.    Review of Systems  Constitutional: Negative.   Respiratory: Negative.    Cardiovascular: Negative.   Gastrointestinal:  Positive for abdominal pain. Negative for abdominal distention, blood in stool, constipation, diarrhea, nausea and vomiting.  Psychiatric/Behavioral:  Positive for dysphoric mood. Negative for agitation, behavioral problems, confusion, decreased concentration and sleep disturbance. The patient is nervous/anxious.        Objective:   Physical Exam Constitutional:      Appearance: Normal appearance. She is well-developed. She is not ill-appearing.  Cardiovascular:     Rate and Rhythm: Normal rate and regular rhythm.     Pulses: Normal pulses.     Heart sounds: Normal heart sounds.  Pulmonary:     Effort: Pulmonary effort is normal.     Breath sounds: Normal breath sounds.  Abdominal:     General: Abdomen is flat. Bowel sounds are normal. There is no distension.     Palpations: Abdomen is soft.  There is no mass.     Tenderness: There is no abdominal tenderness. There is no guarding or rebound.     Hernia: No hernia is present.  Neurological:     Mental Status: She is alert.  Psychiatric:        Mood and Affect: Mood normal.        Behavior: Behavior normal.        Thought Content: Thought content normal.           Assessment & Plan:  RUQ pain, likely due to gall bladder disease. We will set up an abdominal US asap. Otherwise, I agree with her that she is likely allergic to Wellbutrin. I advised her to give this time to settle down, then try taking it one more time. If the itching returns, then she will need to stay off this permanently. In that case, we would need to try something else.  [name], MD

## 2021-07-31 ENCOUNTER — Telehealth: Payer: Self-pay | Admitting: Family Medicine

## 2021-07-31 ENCOUNTER — Ambulatory Visit
Admission: RE | Admit: 2021-07-31 | Discharge: 2021-07-31 | Disposition: A | Discharge status: Home / Self Care | Payer: BC Managed Care – PPO | Source: Ambulatory Visit | Attending: Family Medicine | Admitting: Family Medicine

## 2021-07-31 DIAGNOSIS — R1011 Right upper quadrant pain: Secondary | ICD-10-CM

## 2021-07-31 DIAGNOSIS — K8020 Calculus of gallbladder without cholecystitis without obstruction: Secondary | ICD-10-CM | POA: Diagnosis not present

## 2021-07-31 DIAGNOSIS — K7689 Other specified diseases of liver: Secondary | ICD-10-CM | POA: Diagnosis not present

## 2021-07-31 NOTE — Telephone Encounter (Signed)
Pt called to ask if someone could please call her back and speak to her about her US Abdomen Complete (Accession [id number]) (Order [id number]) Results. Please advise.

## 2021-07-31 NOTE — Telephone Encounter (Signed)
Spoke with pt reviewed [place] results and pt verbalized understanding, pt is aware that she will get a call from General Surgery for scheduling

## 2021-07-31 NOTE — Addendum Note (Signed)
Addended by: Gershon Crane A on: 07/31/2021 01:03 PM   Modules accepted: Orders

## 2022-06-18 ENCOUNTER — Ambulatory Visit: Payer: BC Managed Care – PPO | Admitting: Family Medicine

## 2022-06-19 ENCOUNTER — Ambulatory Visit: Payer: Self-pay | Admitting: Family Medicine

## 2022-06-21 ENCOUNTER — Ambulatory Visit: Payer: Self-pay | Admitting: Family Medicine

## 2022-12-19 ENCOUNTER — Ambulatory Visit (HOSPITAL_BASED_OUTPATIENT_CLINIC_OR_DEPARTMENT_OTHER): Payer: BC Managed Care – PPO | Admitting: Certified Nurse Midwife

## 2022-12-19 ENCOUNTER — Encounter (HOSPITAL_BASED_OUTPATIENT_CLINIC_OR_DEPARTMENT_OTHER): Payer: Self-pay | Admitting: Certified Nurse Midwife

## 2022-12-19 VITALS — BP 192/115 | HR 87 | Ht 68.0 in | Wt 222.4 lb

## 2022-12-19 DIAGNOSIS — Z30430 Encounter for insertion of intrauterine contraceptive device: Secondary | ICD-10-CM | POA: Diagnosis not present

## 2022-12-19 DIAGNOSIS — Z23 Encounter for immunization: Secondary | ICD-10-CM | POA: Diagnosis not present

## 2022-12-19 DIAGNOSIS — Z01419 Encounter for gynecological examination (general) (routine) without abnormal findings: Secondary | ICD-10-CM

## 2022-12-19 MED ORDER — LEVONORGESTREL 19.5 MG IU IUD: INTRAUTERINE_SYSTEM | Freq: Once | INTRAUTERINE | Status: AC

## 2022-12-20 DIAGNOSIS — Z01419 Encounter for gynecological examination (general) (routine) without abnormal findings: Secondary | ICD-10-CM | POA: Insufficient documentation

## 2022-12-20 NOTE — Addendum Note (Signed)
Addended byMerrilee Jansky on: 12/20/2022 12:54 PM   Modules accepted: Level of Service

## 2022-12-20 NOTE — Progress Notes (Signed)
32 y.o. G0P0000 Single White or Caucasian female here for annual exam.  She would like Kyleena removed and new Kyleena inserted. Pt states she is moving, very stressed, hasn't slept well in a few nights. She agrees to check her BP once she returns home and will call office if 120 or higher systolic and 90 or higher diastolic.   No LMP recorded. (Menstrual status: IUD).          Sexually active: Yes.    The current method of family planning is IUD.     Upstream - 12/19/22 1321       Pregnancy Intention Screening   Does the patient want to become pregnant in the next year? No    Does the patient's partner want to become pregnant in the next year? No    Would the patient like to discuss contraceptive options today? Yes            The pregnancy intention screening data noted above was reviewed. Potential methods of contraception were discussed. The patient elected to proceed with No data recorded.  Exercising: Yes.     Smoker:  no  Health Maintenance: Pap:  UTD History of abnormal Pap:  no   reports that she has never smoked. She has never used smokeless tobacco. She reports current alcohol use. She reports that she does not use drugs.  Past Medical History:  Diagnosis Date   Anemia    Anxiety    Depression    IBS (irritable bowel syndrome)     Past Surgical History:  Procedure Laterality Date   BREAST SURGERY     breast abscess    EVACUATION BREAST HEMATOMA Left 04/05/2017   Procedure: IRRIGATION AND DEBRIDMENT LEFT  BREAST ABCESS;  Surgeon: Romie Levee, MD;  Location: [facility];  Service: General;  Laterality: Left;   INTRAUTERINE DEVICE (IUD) INSERTION  07/05/15   [facility]    TONSILLECTOMY AND ADENOIDECTOMY  01/2017    Current Outpatient Medications  Medication Sig Dispense Refill   acetaminophen (TYLENOL) 500 MG tablet Take 2 tablets (1,000 mg total) by mouth every 6 (six) hours as needed. 30 tablet 0   cetirizine (ZYRTEC ALLERGY) 10 MG tablet Take 10 mg by mouth daily.      ibuprofen (ADVIL,MOTRIN) 800 MG tablet Take 1 tablet (800 mg total) by mouth every 8 (eight) hours as needed. 30 tablet 1   levonorgestrel (KYLEENA) 19.5 MG IUD by Intrauterine route.     buPROPion (WELLBUTRIN XL) 150 MG 24 hr tablet Take 1 tablet (150 mg total) by mouth daily. (Patient not taking: Reported on 07/25/2021) 30 tablet 1   No current facility-administered medications for this visit.    Family History  Problem Relation Age of Onset   Anemia Mother    Hypertension Mother    Diabetes Mellitus II Paternal Grandmother 33   Breast cancer Paternal Grandmother 96       lumpectomy and 2 months of chemo   Heart failure Maternal Grandfather    Heart attack Maternal Grandfather    Stroke Maternal Grandmother    Diabetes Paternal Grandfather    Depression Other    Melanoma Other        PGF   Hypertension Other        Maternal Side    Stroke Maternal Aunt    Heart attack Maternal Aunt     ROS: Constitutional: negative Genitourinary:negative  Exam:   BP (!) 192/115 (BP Location: Left Arm, Patient Position: Sitting, Cuff Size: Large)  Pulse 87   Ht 5\' 8"  (1.727 m)   Wt 222 lb 6.4 oz (100.9 kg)   BMI 33.82 kg/m   Height: 5\' 8"  (172.7 cm)  General appearance: alert, cooperative and appears stated age Head: Normocephalic, without obvious abnormality, atraumatic Neck: no adenopathy, supple, symmetrical, trachea midline and thyroid normal to inspection and palpation Lungs: clear to auscultation bilaterally Breasts: normal appearance, no masses or tenderness, Inspection negative, No nipple retraction or dimpling, No nipple discharge or bleeding, No axillary or supraclavicular adenopathy, Normal to palpation without dominant masses Heart: regular rate and rhythm Abdomen: soft, non-tender; bowel sounds normal; no masses,  no organomegaly Extremities: extremities normal, atraumatic, no cyanosis or edema Skin: Skin color, texture, turgor normal. No rashes or lesions Lymph  nodes: Cervical, supraclavicular, and axillary nodes normal. No abnormal inguinal nodes palpated Neurologic: Grossly normal   Pelvic: External genitalia:  no lesions              Urethra:  normal appearing urethra with no masses, tenderness or lesions              Bartholins and Skenes: normal                 Vagina: normal appearing vagina with normal color and no discharge, no lesions              Cervix: no lesions              Pap taken: No. Bimanual Exam:  Uterus:  normal size, contour, position, consistency, mobility, non-tender              Adnexa: no mass, fullness, tenderness             Chaperone, Hendricks Milo, CMA, was present for exam.  Assessment/Plan:  1. Encounter for insertion of Kyleena IUD - levonorgestrel (KYLEENA) 19.5 MG IUD  2. Need for influenza vaccination - Flu vaccine trivalent PF, 6mos and older(Flulaval,Afluria,Fluarix,Fluzone)   3. Encounter for annual gynecological exam    GYNECOLOGY OFFICE PROCEDURE NOTE  Grizel Vesely is a 32 y.o. G0P0000 here for [place]  IUD insertion. No GYN concerns.  Last pap smear was on 06/2021 and was normal.  IUD Insertion Procedure Note Patient identified, informed consent performed, consent signed.   Discussed risks of irregular bleeding, cramping, infection, malpositioning or misplacement of the IUD outside the uterus which may require further procedure such as laparoscopy. Time out was performed.  Pt is not sexually active.  Speculum placed in the vagina.  Cervix visualized.  Cleaned with Betadine x 2.  Grasped anteriorly with a single tooth tenaculum.  Uterus sounded to 6 cm.  Kyleena  IUD placed per manufacturer's recommendations.  Strings trimmed to 3 cm. Tenaculum was removed, good hemostasis noted.  Patient tolerated procedure well.   Patient was given post-procedure instructions.  She was advised to have backup contraception for one week.  Patient was also asked to check IUD strings periodically and follow up  in 4 weeks for IUD check.   Letta Kocher, CNM 12:46 PM

## 2023-02-01 ENCOUNTER — Ambulatory Visit: Payer: BC Managed Care – PPO | Admitting: Family Medicine

## 2023-02-01 ENCOUNTER — Encounter: Payer: Self-pay | Admitting: Family Medicine

## 2023-02-01 VITALS — BP 168/110 | HR 92 | Temp 98.4°F | Wt 180.0 lb

## 2023-02-01 DIAGNOSIS — F418 Other specified anxiety disorders: Secondary | ICD-10-CM

## 2023-02-01 DIAGNOSIS — I10 Essential (primary) hypertension: Secondary | ICD-10-CM | POA: Insufficient documentation

## 2023-02-01 LAB — BASIC METABOLIC PANEL
BUN: 13 mg/dL (ref 6–23)
CO2: 26 meq/L (ref 19–32)
Calcium: 8.8 mg/dL (ref 8.4–10.5)
Chloride: 104 meq/L (ref 96–112)
Creatinine, Ser: 0.61 mg/dL (ref 0.40–1.20)
GFR: 120.28 mL/min (ref >60.00)
Glucose, Bld: 98 mg/dL (ref 70–99)
Potassium: 3.7 meq/L (ref 3.5–5.1)
Sodium: 138 meq/L (ref 135–145)

## 2023-02-01 LAB — CBC WITH DIFFERENTIAL/PLATELET
Basophils Absolute: 0 10*3/uL (ref 0.0–0.1)
Basophils Relative: 0.6 % (ref 0.0–3.0)
Eosinophils Absolute: 0 10*3/uL (ref 0.0–0.7)
Eosinophils Relative: 0.7 % (ref 0.0–5.0)
HCT: 41.3 % (ref 36.0–46.0)
Hemoglobin: 13.6 g/dL (ref 12.0–15.0)
Lymphocytes Relative: 25 % (ref 12.0–46.0)
Lymphs Abs: 1.5 10*3/uL (ref 0.7–4.0)
MCHC: 32.8 g/dL (ref 30.0–36.0)
MCV: 85.3 fL (ref 78.0–100.0)
Monocytes Absolute: 0.4 10*3/uL (ref 0.1–1.0)
Monocytes Relative: 6.6 % (ref 3.0–12.0)
Neutro Abs: 4.1 10*3/uL (ref 1.4–7.7)
Neutrophils Relative %: 67.1 % (ref 43.0–77.0)
Platelets: 175 10*3/uL (ref 150.0–400.0)
RBC: 4.84 Mil/uL (ref 3.87–5.11)
RDW: 14.1 % (ref 11.5–15.5)
WBC: 6.1 10*3/uL (ref 4.0–10.5)

## 2023-02-01 LAB — TSH: TSH: 0.95 u[IU]/mL (ref 0.35–5.50)

## 2023-02-01 LAB — HEPATIC FUNCTION PANEL
ALT: 23 U/L (ref 0–35)
AST: 21 U/L (ref 0–37)
Albumin: 4.5 g/dL (ref 3.5–5.2)
Alkaline Phosphatase: 73 U/L (ref 39–117)
Bilirubin, Direct: 0.2 mg/dL (ref 0.0–0.3)
Total Bilirubin: 0.8 mg/dL (ref 0.2–1.2)
Total Protein: 7.9 g/dL (ref 6.0–8.3)

## 2023-02-01 MED ORDER — METOPROLOL SUCCINATE ER 50 MG PO TB24: 50.0000 mg | ORAL_TABLET | Freq: Every day | ORAL | 3 refills | Status: DC

## 2023-02-01 MED ORDER — LORAZEPAM 0.5 MG PO TABS: 0.5000 mg | ORAL_TABLET | Freq: Two times a day (BID) | ORAL | 0 refills | Status: AC | PRN

## 2023-02-01 NOTE — Progress Notes (Signed)
   Subjective:    Patient ID: April Rivas, female    DOB: 12-29-1990, 32 y.o.   MRN: [id number]  HPI Here for uncontrolled anxiety and very high BP readings. She has felt very anxious for the past few months, and this makes it hard for her to sleep. She asks to try some Lorazepam again (she took this some years ago and did well with it). She has been trying to find a psychiatrist for this, but she wants something to take as needed until she finds one. Her BP has also been running very high for the past few months. This gives her headaches, but she denies any chest pains or SOB. No ankle swelling. Her BP at her GYN office on 12-19-22 was 192/115. She does not smoke or vape, and she limits her caffeine intake to 2 cups of coffee a day.    Review of Systems  Constitutional: Negative.   Respiratory: Negative.    Cardiovascular: Negative.   Gastrointestinal: Negative.   Genitourinary: Negative.   Neurological:  Positive for headaches.       Objective:   Physical Exam Constitutional:      Appearance: Normal appearance.  Cardiovascular:     Rate and Rhythm: Regular rhythm. Tachycardia present.     Pulses: Normal pulses.     Heart sounds: Normal heart sounds.  Pulmonary:     Effort: Pulmonary effort is normal.     Breath sounds: Normal breath sounds.  Musculoskeletal:     Right lower leg: No edema.     Left lower leg: No edema.  Neurological:     General: No focal deficit present.     Mental Status: She is alert and oriented to person, place, and time.  Psychiatric:        Behavior: Behavior normal.        Thought Content: Thought content normal.     Comments: She is anxious            Assessment & Plan:  For her depression with anxiety, she can use Lorazepam 05 mg TID as needed. She plans to get established with a psychiatrist asap. She also has HTN, and we will treat this with Metoprolol succinate 50 mg daily. Get labs to day for renal function, etc. Follow up in 3 weeks.   Gershon Crane, MD

## 2023-02-20 ENCOUNTER — Ambulatory Visit: Payer: BC Managed Care – PPO | Admitting: Family Medicine

## 2023-02-21 ENCOUNTER — Ambulatory Visit (HOSPITAL_BASED_OUTPATIENT_CLINIC_OR_DEPARTMENT_OTHER): Payer: BC Managed Care – PPO | Admitting: Obstetrics & Gynecology

## 2023-02-27 ENCOUNTER — Ambulatory Visit (HOSPITAL_BASED_OUTPATIENT_CLINIC_OR_DEPARTMENT_OTHER): Payer: BC Managed Care – PPO | Admitting: Certified Nurse Midwife

## 2023-02-28 ENCOUNTER — Ambulatory Visit: Payer: BC Managed Care – PPO | Admitting: Family Medicine

## 2023-03-07 ENCOUNTER — Telehealth: Payer: Self-pay | Admitting: Family Medicine

## 2023-03-07 NOTE — Telephone Encounter (Signed)
Is she sick? What are her symptoms? What is the medication being offered?

## 2023-03-07 NOTE — Telephone Encounter (Signed)
Copied from CRM [id number]. 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

## 2023-03-07 NOTE — Telephone Encounter (Signed)
Spoke with pt advised per Dr Clent Ridges ok to take Tamiflu. Advised pt to call the office with any concerns

## 2023-03-11 ENCOUNTER — Ambulatory Visit: Payer: BC Managed Care – PPO | Admitting: Family Medicine

## 2023-03-18 ENCOUNTER — Ambulatory Visit (HOSPITAL_BASED_OUTPATIENT_CLINIC_OR_DEPARTMENT_OTHER): Payer: BC Managed Care – PPO | Admitting: Certified Nurse Midwife

## 2023-04-28 ENCOUNTER — Other Ambulatory Visit: Payer: Self-pay | Admitting: Family Medicine

## 2023-07-04 ENCOUNTER — Ambulatory Visit (HOSPITAL_BASED_OUTPATIENT_CLINIC_OR_DEPARTMENT_OTHER): Payer: BC Managed Care – PPO | Admitting: Obstetrics & Gynecology

## 2023-07-09 IMAGING — US US ABDOMEN COMPLETE
1 series · 14 of 25 positions shown · non-contrast
Comparison: None Available.

CLINICAL DATA: Right upper quadrant pain.

EXAM:
ABDOMEN ULTRASOUND COMPLETE

[Series 1: us abdomen complete · 0.19mm/px · 14 of 71 slices shown]
[im 1/71]
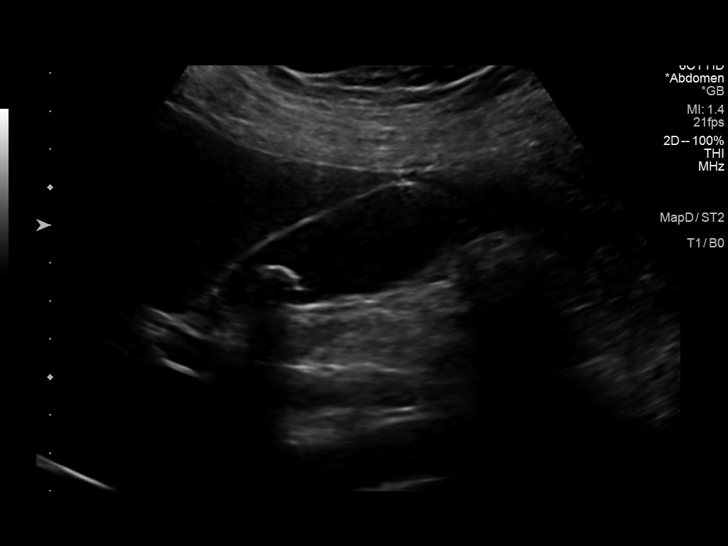
[im 6/71]
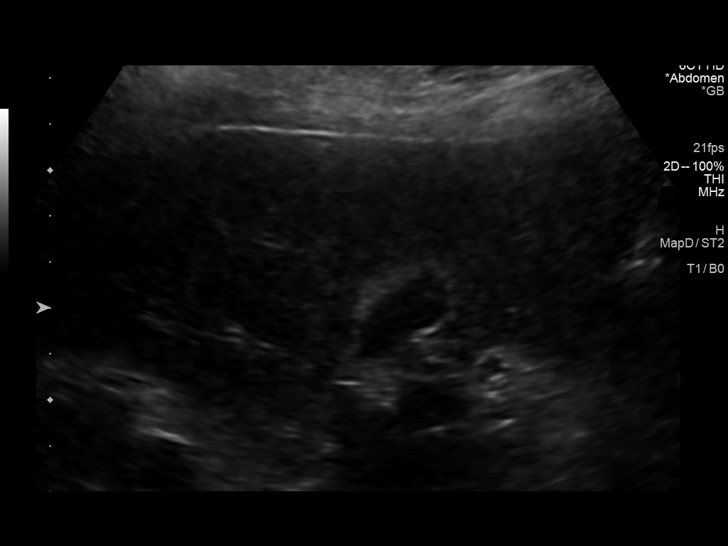
[im 12/71]
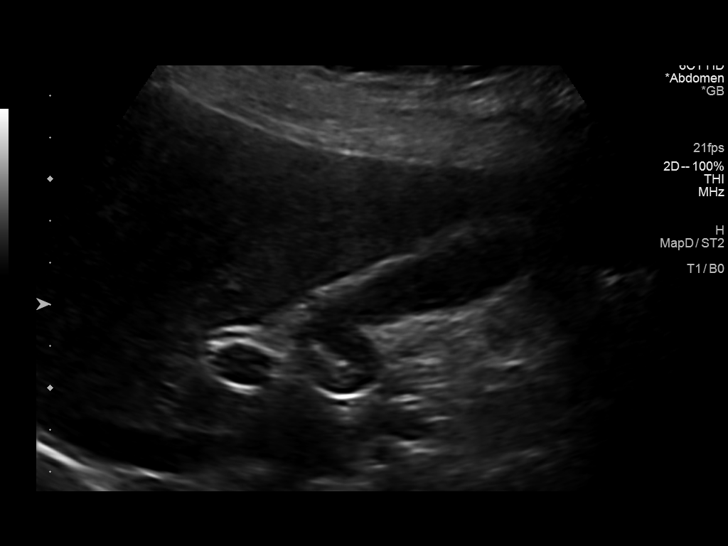
[im 18/71]
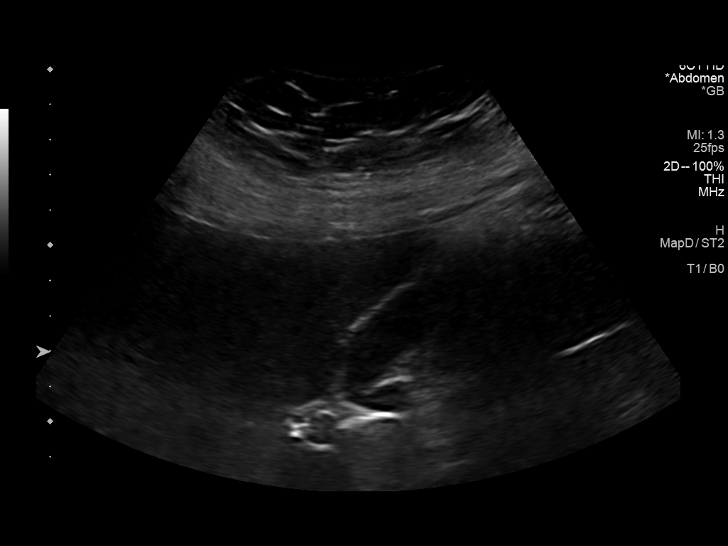
[im 24/71]
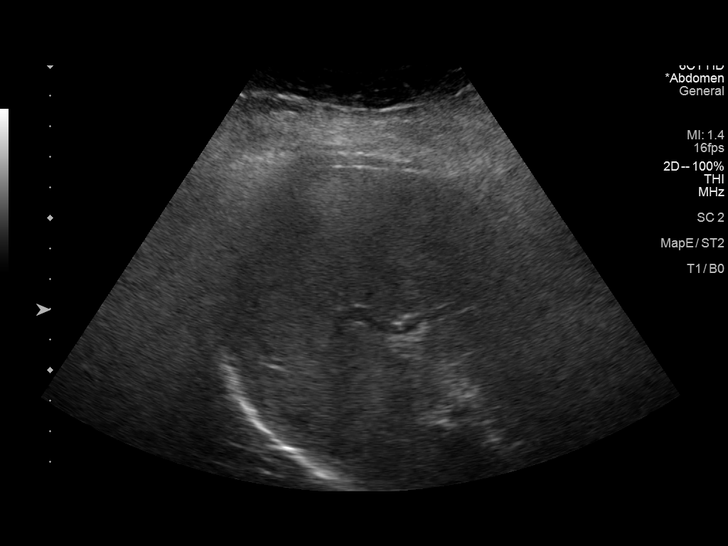
[im 27/71]
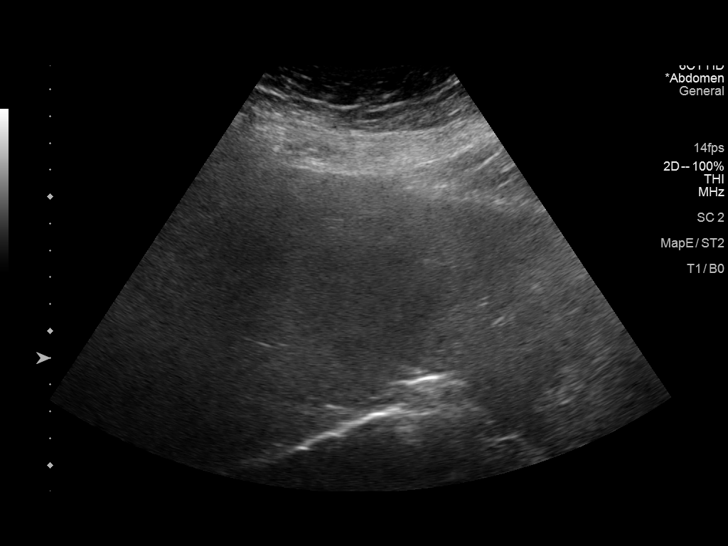
[im 33/71]
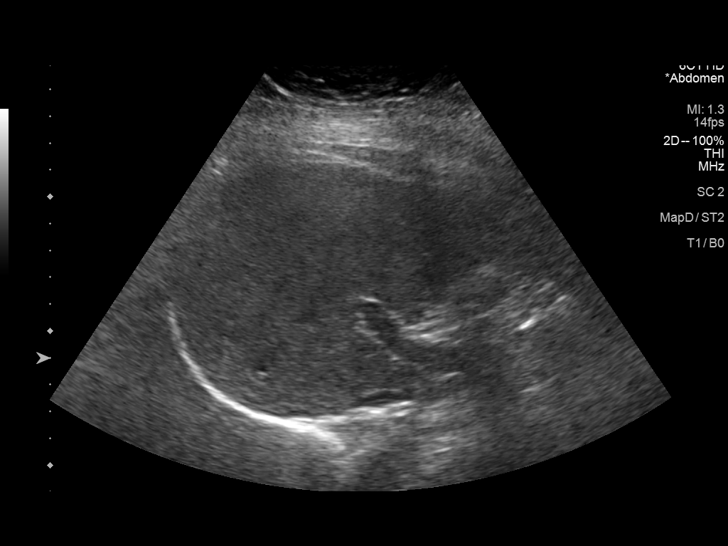
[im 38/71]
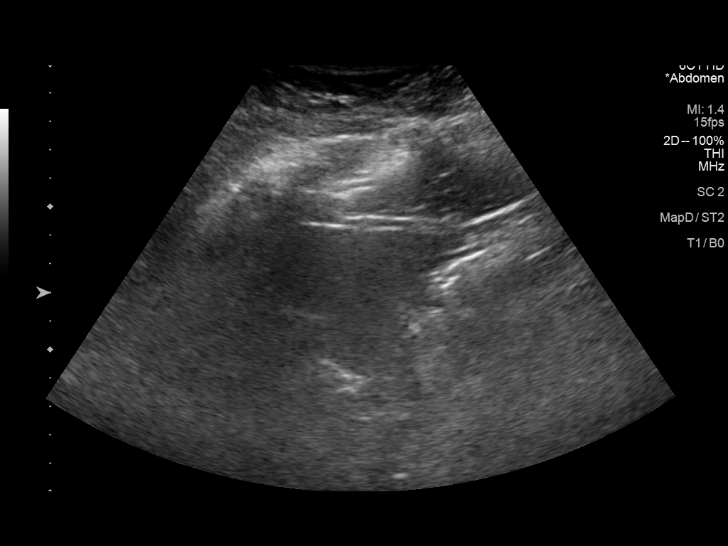
[im 44/71]
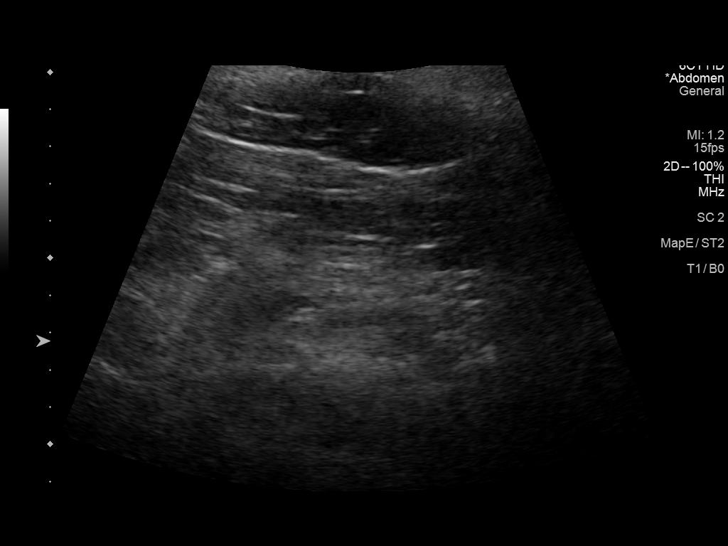
[im 47/71]
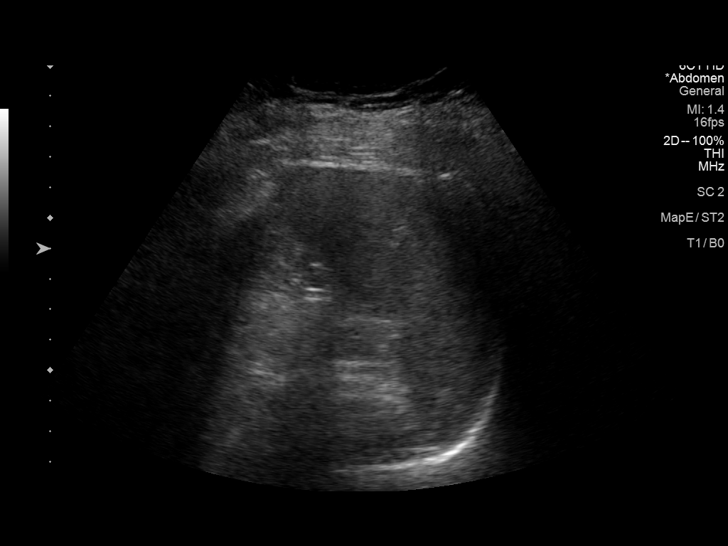
[im 53/71]
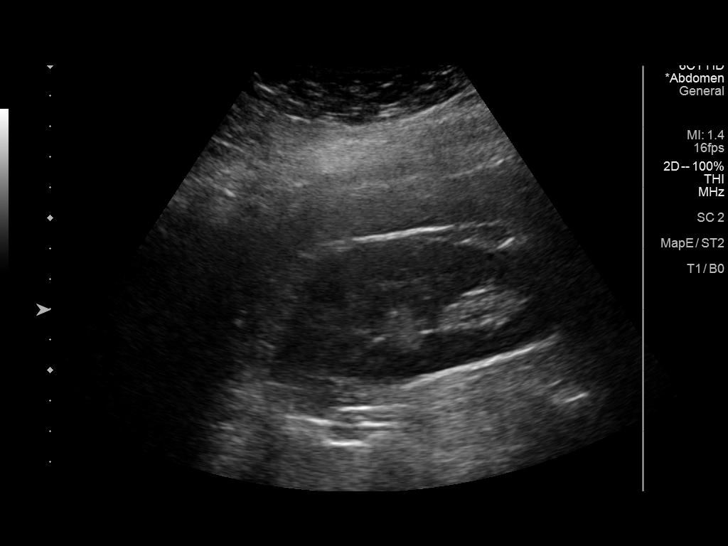
[im 59/71]
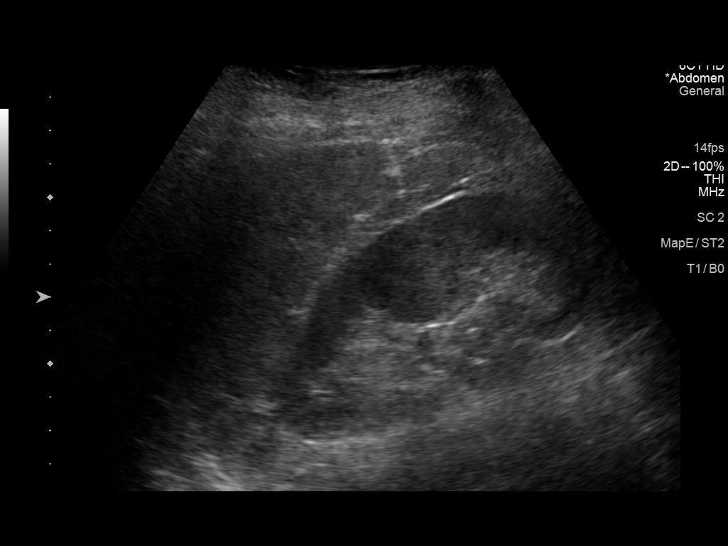
[im 65/71]
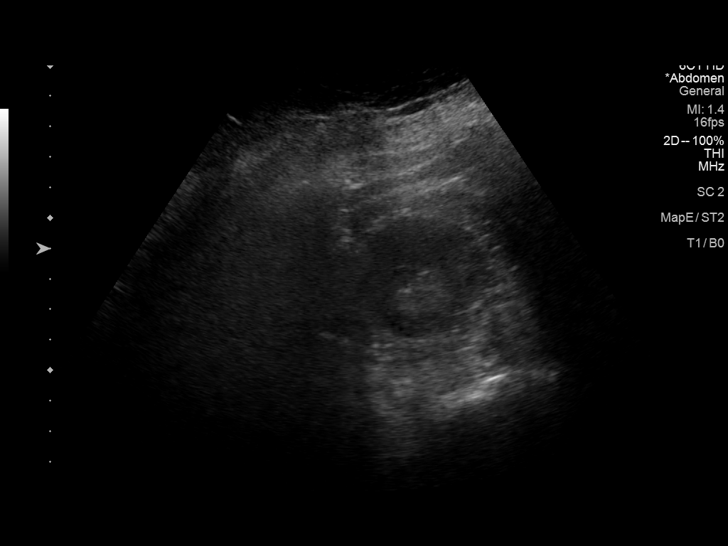
[im 71/71]
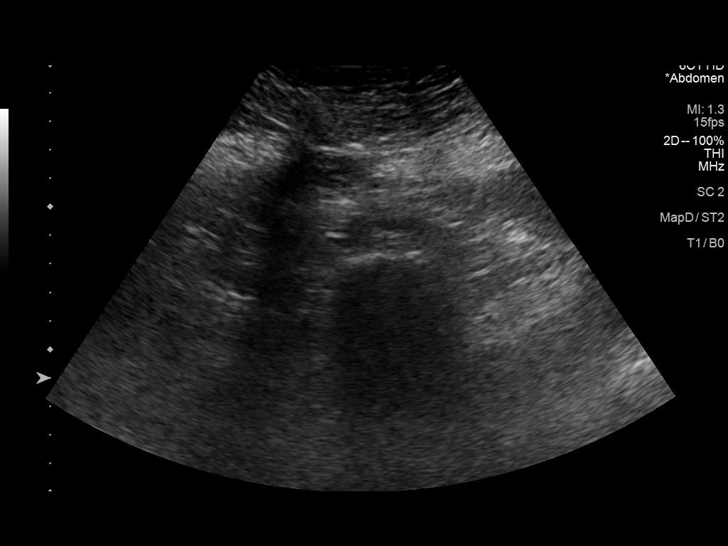

[14 of 25 positions shown; findings below may reference images not displayed]

FINDINGS: Gallbladder: Multiple mobile dependent gallstones, largest 2 cm. No
wall thickening. No pericholecystic fluid.

Common bile duct: Diameter: 4 mm.

Liver: Heterogeneous echogenicity. Normal in size. No mass or focal
lesion. Portal vein is patent on color Doppler imaging with normal
direction of blood flow towards the liver.

IVC: Obscured by midline bowel gas.

Pancreas: Visualized portion unremarkable.

Spleen: Size and appearance within normal limits.

Right Kidney: Length: 11.4 cm. Echogenicity within normal limits. No
mass or hydronephrosis visualized.

Left Kidney: Length: 12.4 cm. Echogenicity within normal limits. No
mass or hydronephrosis visualized.

Abdominal aorta: No aneurysm visualized.

Other findings: None.
IMPRESSION: 1. No acute findings.
2. Cholelithiasis without evidence of acute cholecystitis.

## 2023-10-20 ENCOUNTER — Other Ambulatory Visit: Payer: Self-pay | Admitting: Family Medicine
